# Patient Record
Sex: Female | Born: 1980 | Race: Black or African American | Hispanic: No | Marital: Married | Smoking: Never smoker
Health system: Southern US, Community
[De-identification: ages and names within clinical notes are randomized; demographics above are authoritative.]

## PROBLEM LIST (undated history)

## (undated) ENCOUNTER — Inpatient Hospital Stay (HOSPITAL_COMMUNITY): Payer: Self-pay

## (undated) DIAGNOSIS — R87629 Unspecified abnormal cytological findings in specimens from vagina: Secondary | ICD-10-CM

## (undated) DIAGNOSIS — E669 Obesity, unspecified: Secondary | ICD-10-CM

## (undated) DIAGNOSIS — F419 Anxiety disorder, unspecified: Secondary | ICD-10-CM

## (undated) DIAGNOSIS — Z8619 Personal history of other infectious and parasitic diseases: Secondary | ICD-10-CM

## (undated) DIAGNOSIS — Z8719 Personal history of other diseases of the digestive system: Secondary | ICD-10-CM

## (undated) DIAGNOSIS — B977 Papillomavirus as the cause of diseases classified elsewhere: Secondary | ICD-10-CM

## (undated) DIAGNOSIS — Z8742 Personal history of other diseases of the female genital tract: Secondary | ICD-10-CM

## (undated) DIAGNOSIS — D573 Sickle-cell trait: Secondary | ICD-10-CM

## (undated) DIAGNOSIS — E119 Type 2 diabetes mellitus without complications: Secondary | ICD-10-CM

## (undated) DIAGNOSIS — B009 Herpesviral infection, unspecified: Secondary | ICD-10-CM

## (undated) DIAGNOSIS — K219 Gastro-esophageal reflux disease without esophagitis: Secondary | ICD-10-CM

## (undated) DIAGNOSIS — IMO0002 Reserved for concepts with insufficient information to code with codable children: Secondary | ICD-10-CM

## (undated) DIAGNOSIS — D649 Anemia, unspecified: Secondary | ICD-10-CM

## (undated) DIAGNOSIS — M1990 Unspecified osteoarthritis, unspecified site: Secondary | ICD-10-CM

## (undated) HISTORY — DX: Herpesviral infection, unspecified: B00.9

## (undated) HISTORY — PX: COLPOSCOPY: SHX161

## (undated) HISTORY — DX: Personal history of other infectious and parasitic diseases: Z86.19

## (undated) HISTORY — DX: Unspecified abnormal cytological findings in specimens from vagina: R87.629

## (undated) HISTORY — PX: WISDOM TOOTH EXTRACTION: SHX21

## (undated) HISTORY — DX: Obesity, unspecified: E66.9

---

## 2011-10-14 ENCOUNTER — Other Ambulatory Visit: Payer: Self-pay

## 2011-10-14 LAB — OB RESULTS CONSOLE ANTIBODY SCREEN: Antibody Screen: NEGATIVE

## 2011-10-14 LAB — OB RESULTS CONSOLE ABO/RH

## 2011-10-14 LAB — OB RESULTS CONSOLE GC/CHLAMYDIA
Chlamydia: NEGATIVE
Gonorrhea: NEGATIVE

## 2011-10-14 LAB — OB RESULTS CONSOLE RUBELLA ANTIBODY, IGM: Rubella: IMMUNE

## 2011-11-30 ENCOUNTER — Ambulatory Visit: Payer: Self-pay | Admitting: *Deleted

## 2012-01-06 ENCOUNTER — Encounter: Payer: Self-pay | Admitting: Dietician

## 2012-01-06 ENCOUNTER — Encounter: Payer: 59 | Attending: Obstetrics & Gynecology | Admitting: Dietician

## 2012-01-06 VITALS — Ht 66.0 in | Wt 257.8 lb

## 2012-01-06 DIAGNOSIS — E119 Type 2 diabetes mellitus without complications: Secondary | ICD-10-CM | POA: Insufficient documentation

## 2012-01-06 DIAGNOSIS — Z713 Dietary counseling and surveillance: Secondary | ICD-10-CM | POA: Insufficient documentation

## 2012-01-07 ENCOUNTER — Encounter: Payer: Self-pay | Admitting: Dietician

## 2012-01-07 NOTE — Progress Notes (Signed)
  Patient was seen on 01/06/2012 for Gestational Diabetes self-management class at the [facility]. Ms Komoroski comes with a history of having been diagnosed with Type 2 diabetes in April 2013.  At that time was started on Metformin for glucose control.  When she became pregnant, her MD in [place] told her to stop taking the Metformin.  Currently, she is on no medications for blood glucose control.  She has a blood glucose meter and has resumed checking her fasting blood glucose levels over the last 6-7 days.  Her numbers range from 85-107 mg/dl. Her last HgA1C is reported at 6.9%.    Her EDD is 06/11/2012 and she is currently [redacted] weeks pregnant. Her weight today is 257.8 lb.  She is tending to hover at 256 lb.  The following learning objectives were met by the patient during this course/1;1 session:   States the definition of Gestational Diabetes  States why dietary management is important in controlling blood glucose  Describes the effects each nutrient has on blood glucose levels  Demonstrates ability to create a balanced meal plan  Demonstrates carbohydrate counting   States when to check blood glucose levels  Demonstrates proper blood glucose monitoring techniques  States the effect of stress and exercise on blood glucose levels  States the importance of limiting caffeine and abstaining from alcohol and smoking  Blood glucose monitor given: She has a Financial planner and some strips.  She has been instructed to monitor her blood glucose four times per day.  I have ask her to call her insurance company and find out what their preferred meter is.  This will most likely be her lowest co-pay.  She is to get back with me and perhaps if we have that meter available, I can give her the preferred meter.  She will need an MD prescription for strips and lancets.  Patient instructed to monitor glucose levels: FBS: 60 - <90 2 hour: <120  Patient received  handouts:  Nutrition Diabetes and Pregnancy  Carbohydrate Counting List  Patient will be seen for follow-up as needed.

## 2012-01-18 ENCOUNTER — Other Ambulatory Visit (HOSPITAL_COMMUNITY): Payer: Self-pay | Admitting: Obstetrics & Gynecology

## 2012-01-18 DIAGNOSIS — O24919 Unspecified diabetes mellitus in pregnancy, unspecified trimester: Secondary | ICD-10-CM

## 2012-01-20 ENCOUNTER — Encounter (HOSPITAL_COMMUNITY): Payer: Self-pay

## 2012-01-20 ENCOUNTER — Ambulatory Visit (HOSPITAL_COMMUNITY)
Admission: RE | Admit: 2012-01-20 | Discharge: 2012-01-20 | Disposition: A | Discharge status: Home / Self Care | Payer: 59 | Source: Ambulatory Visit | Attending: Obstetrics & Gynecology | Admitting: Obstetrics & Gynecology

## 2012-01-20 DIAGNOSIS — O358XX0 Maternal care for other (suspected) fetal abnormality and damage, not applicable or unspecified: Secondary | ICD-10-CM | POA: Insufficient documentation

## 2012-01-20 DIAGNOSIS — O24919 Unspecified diabetes mellitus in pregnancy, unspecified trimester: Secondary | ICD-10-CM | POA: Insufficient documentation

## 2012-01-20 DIAGNOSIS — Z1389 Encounter for screening for other disorder: Secondary | ICD-10-CM | POA: Insufficient documentation

## 2012-01-20 DIAGNOSIS — Z363 Encounter for antenatal screening for malformations: Secondary | ICD-10-CM | POA: Insufficient documentation

## 2012-01-30 ENCOUNTER — Encounter (HOSPITAL_COMMUNITY): Payer: Self-pay | Admitting: Obstetrics and Gynecology

## 2012-01-30 ENCOUNTER — Inpatient Hospital Stay (HOSPITAL_COMMUNITY)
Admission: AD | Admit: 2012-01-30 | Discharge: 2012-01-30 | Disposition: A | Discharge status: Home / Self Care | Payer: 59 | Source: Ambulatory Visit | Attending: Obstetrics and Gynecology | Admitting: Obstetrics and Gynecology

## 2012-01-30 DIAGNOSIS — R109 Unspecified abdominal pain: Secondary | ICD-10-CM | POA: Insufficient documentation

## 2012-01-30 DIAGNOSIS — N949 Unspecified condition associated with female genital organs and menstrual cycle: Secondary | ICD-10-CM

## 2012-01-30 DIAGNOSIS — O4700 False labor before 37 completed weeks of gestation, unspecified trimester: Secondary | ICD-10-CM | POA: Insufficient documentation

## 2012-01-30 DIAGNOSIS — O479 False labor, unspecified: Secondary | ICD-10-CM

## 2012-01-30 HISTORY — DX: Type 2 diabetes mellitus without complications: E11.9

## 2012-01-30 LAB — URINALYSIS, ROUTINE W REFLEX MICROSCOPIC
Ketones, ur: >80 mg/dL — AB
Leukocytes, UA: NEGATIVE
Nitrite: NEGATIVE
Protein, ur: NEGATIVE mg/dL
pH: 5.5 (ref 5.0–8.0)

## 2012-01-30 NOTE — MAU Note (Signed)
Pt presents to MAU with chief complaint of contractions. Pt is [redacted]w[redacted]d G1 says around 1230 she started feeling contractions and they continued for the rest of the afternoon. Pt says in a 1 hour period she had 6 contractions; non painful, feels them in her lower abdomen.

## 2012-01-30 NOTE — MAU Provider Note (Signed)
Chief Complaint:  Contractions   First Provider Initiated Contact with Patient 01/30/12 1605      HPI: Kimberly White is a 31 y.o. G1P0000 at 64w0dwho presents to maternity admissions reporting cramping/contractions 6x/hour from 1-2 pm.  Prior to this she had 1-2/hour and she has had 1-2/hour for the last 2 hours.  She reports drinking lots of fluids and urinating more frequently than normal today.  She reports feeling fetal movement, denies LOF, vaginal bleeding, vaginal itching/burning, urinary symptoms, h/a, dizziness, n/v, or fever/chills.     Past Medical History: Past Medical History  Diagnosis Date  . Obesity   . Diabetes mellitus without complication      Past Surgical History: History reviewed. No pertinent past surgical history.  Family History: Family History  Problem Relation Age of Onset  . Hypertension Mother   . Hypertension Father   . Hyperlipidemia Father   . Sleep apnea Brother   . Asthma Maternal Uncle   . Cancer Maternal Uncle   . Hypertension Maternal Grandmother   . Obesity Maternal Grandmother   . Hypertension Maternal Grandfather   . Stroke Other     Social History: History  Substance Use Topics  . Smoking status: Never Smoker   . Smokeless tobacco: Never Used  . Alcohol Use: No    Allergies: No Known Allergies  Meds:  Prescriptions prior to admission  Medication Sig Dispense Refill  . Ascorbic Acid (VITAMIN C) 1000 MG tablet Take 1,000 mg by mouth daily.      . calcium carbonate (TUMS - DOSED IN MG ELEMENTAL CALCIUM) 500 MG chewable tablet Chew 1 tablet by mouth daily. Using as needed for heartburn.      . docusate sodium (COLACE) 100 MG capsule Take 100 mg by mouth daily as needed. constipation      . Prenatal Vit-Fe Fumarate-FA (PRENATAL MULTIVITAMIN) TABS Take 1 tablet by mouth daily.        ROS: Pertinent findings in history of present illness.  Physical Exam  Blood pressure 115/66, pulse 85, temperature 99 F (37.2 C),  temperature source Oral, resp. rate 18. GENERAL: Well-developed, well-nourished female in no acute distress.  HEENT: normocephalic HEART: normal rate RESP: normal effort ABDOMEN: Soft, non-tender, gravid appropriate for gestational age EXTREMITIES: Nontender, no edema NEURO: alert and oriented SPECULUM EXAM: NEFG, physiologic discharge, no blood, cervix clean Dilation: Closed Effacement (%): Thick Cervical Position: Posterior Exam by:: L. Leftwhich-kirby  FHT:  155 by doppler No contractions on toco  Results for orders placed during the hospital encounter of 01/30/12 (from the past 24 hour(s))  URINALYSIS, ROUTINE W REFLEX MICROSCOPIC     Status: Abnormal   Collection Time   01/30/12  3:42 PM      Component Value Range   Color, Urine YELLOW  YELLOW   APPearance CLEAR  CLEAR   Specific Gravity, Urine 1.020  1.005 - 1.030   pH 5.5  5.0 - 8.0   Glucose, UA NEGATIVE  NEGATIVE mg/dL   Hgb urine dipstick NEGATIVE  NEGATIVE   Bilirubin Urine NEGATIVE  NEGATIVE   Ketones, ur >80 (*) NEGATIVE mg/dL   Protein, ur NEGATIVE  NEGATIVE mg/dL   Urobilinogen, UA 0.2  0.0 - 1.0 mg/dL   Nitrite NEGATIVE  NEGATIVE   Leukocytes, UA NEGATIVE  NEGATIVE   Assessment: 1. Round ligament pain   2. Braxton Hicks contractions    Plan: Called Dr Henderson Cloud to review assessment and findings Discharge home Drink more PO fluids, water is best Reviewed good  ergonomics/positioning Follow up as scheduled with Dr Langston Masker Return to MAU as needed     Medication List     As of 01/30/2012  4:13 PM    ASK your doctor about these medications         calcium carbonate 500 MG chewable tablet   Commonly known as: TUMS - dosed in mg elemental calcium   Chew 1 tablet by mouth daily. Using as needed for heartburn.      docusate sodium 100 MG capsule   Commonly known as: COLACE   Take 100 mg by mouth daily as needed. constipation      prenatal multivitamin Tabs   Take 1 tablet by mouth daily.      vitamin  C 1000 MG tablet   Take 1,000 mg by mouth daily.        Sharen Counter Certified Nurse-Midwife 01/30/2012 4:13 PM

## 2012-02-01 ENCOUNTER — Telehealth: Payer: Self-pay | Admitting: Dietician

## 2012-02-01 NOTE — Telephone Encounter (Signed)
Phone Call; 02/01/2012  Siri spoke with her insurance company and they will cover an Accu-Chek or a One Touch Ultra glucose meter.  I have an Accu Chek [employer] Plus meter Lot: [id number], Exp: 03/10/2013 in stock.  I will plan to leave it at the desk for her to pick-up this afternoon.  Maggie Lynkin Saini

## 2012-03-09 ENCOUNTER — Encounter (HOSPITAL_COMMUNITY): Payer: Self-pay

## 2012-03-09 ENCOUNTER — Inpatient Hospital Stay (HOSPITAL_COMMUNITY)
Admission: AD | Admit: 2012-03-09 | Discharge: 2012-03-09 | Disposition: A | Discharge status: Home / Self Care | Payer: 59 | Source: Ambulatory Visit | Attending: Obstetrics and Gynecology | Admitting: Obstetrics and Gynecology

## 2012-03-09 DIAGNOSIS — O99891 Other specified diseases and conditions complicating pregnancy: Secondary | ICD-10-CM | POA: Insufficient documentation

## 2012-03-09 DIAGNOSIS — R109 Unspecified abdominal pain: Secondary | ICD-10-CM

## 2012-03-09 DIAGNOSIS — O26899 Other specified pregnancy related conditions, unspecified trimester: Secondary | ICD-10-CM

## 2012-03-09 HISTORY — DX: Sickle-cell trait: D57.3

## 2012-03-09 HISTORY — DX: Papillomavirus as the cause of diseases classified elsewhere: B97.7

## 2012-03-09 HISTORY — DX: Reserved for concepts with insufficient information to code with codable children: IMO0002

## 2012-03-09 HISTORY — DX: Personal history of other diseases of the female genital tract: Z87.42

## 2012-03-09 LAB — URINE MICROSCOPIC-ADD ON

## 2012-03-09 LAB — URINALYSIS, ROUTINE W REFLEX MICROSCOPIC
Bilirubin Urine: NEGATIVE
Glucose, UA: NEGATIVE mg/dL
Protein, ur: NEGATIVE mg/dL
Specific Gravity, Urine: <1.005 — ABNORMAL LOW (ref 1.005–1.030)
Urobilinogen, UA: 0.2 mg/dL (ref 0.0–1.0)

## 2012-03-09 NOTE — MAU Note (Signed)
Pt states had extremely sharp abd pain on mid/left side of her abdomen 30 minutes ago, happened again moments later. Has not felt since then. Felt like lightning per pt.

## 2012-03-09 NOTE — MAU Note (Signed)
Patient is in with c/o new onset pelvic pain that intensifies with movement. She denies any contractions, vaginal bleeding or lof. Patient is a type II diet controlled.

## 2012-03-09 NOTE — MAU Provider Note (Signed)
History     CSN: [id number]  Arrival date and time: 03/09/12 1610   None     Chief Complaint  Patient presents with  . Abdominal Pain   HPI Pt is [redacted]w[redacted]d pregnant and presents with sharp shotting pain X2 at 3 pm today lasting seconds- 2 occurances within 3 minutes and no more.  Pt denies persistant pain, spotting or bleeding or UTI symptoms.  Pt has been constipated but had a normal bowel movement this morning before the pain.  Past Medical History  Diagnosis Date  . Obesity   . Diabetes mellitus without complication     No past surgical history on file.  Family History  Problem Relation Age of Onset  . Hypertension Mother   . Hypertension Father   . Hyperlipidemia Father   . Sleep apnea Brother   . Asthma Maternal Uncle   . Cancer Maternal Uncle   . Hypertension Maternal Grandmother   . Obesity Maternal Grandmother   . Hypertension Maternal Grandfather   . Stroke Other     History  Substance Use Topics  . Smoking status: Never Smoker   . Smokeless tobacco: Never Used  . Alcohol Use: No    Allergies: No Known Allergies  Prescriptions prior to admission  Medication Sig Dispense Refill  . Ascorbic Acid (VITAMIN C) 1000 MG tablet Take 1,000 mg by mouth daily.      . calcium carbonate (TUMS - DOSED IN MG ELEMENTAL CALCIUM) 500 MG chewable tablet Chew 1 tablet by mouth daily as needed. For heartburn.      . docusate sodium (COLACE) 100 MG capsule Take 100 mg by mouth daily as needed. For constipation.      . Prenatal Vit-Fe Fumarate-FA (PRENATAL MULTIVITAMIN) TABS Take 1 tablet by mouth daily.        Review of Systems  Constitutional: Negative for fever and chills.  Gastrointestinal: Positive for abdominal pain and constipation. Negative for nausea, vomiting and diarrhea.  Genitourinary: Negative for dysuria and urgency.   Physical Exam   Blood pressure 102/73, pulse 94, temperature 97.9 F (36.6 C), temperature source Oral, resp. rate 16, height 5\' 6"  (1.676  m), weight 254 lb 8 oz (115.44 kg).  Physical Exam  Vitals reviewed. Constitutional: She is oriented to person, place, and time. She appears well-developed and well-nourished.  HENT:  Head: Normocephalic.  Eyes: Pupils are equal, round, and reactive to light.  Neck: Normal range of motion. Neck supple.  Respiratory: Effort normal.  GI: Soft. She exhibits no distension. There is no tenderness. There is no rebound and no guarding.       FHR reassuring for gestational age; no ctx; active baby  Musculoskeletal: Normal range of motion.  Neurological: She is alert and oriented to person, place, and time.  Skin: Skin is warm and dry.  Psychiatric: She has a normal mood and affect.    MAU Course  Procedures No pain or tenderness  And no contractions noted; discussed pregnancy belt Pt has appointment with [facility] next Tuesday Results for orders placed during the hospital encounter of 03/09/12 (from the past 24 hour(s))  URINALYSIS, ROUTINE W REFLEX MICROSCOPIC     Status: Abnormal   Collection Time   03/09/12  4:24 PM      Component Value Range   Color, Urine YELLOW  YELLOW   APPearance CLEAR  CLEAR   Specific Gravity, Urine <1.005 (*) 1.005 - 1.030   pH 6.5  5.0 - 8.0   Glucose, UA  NEGATIVE  NEGATIVE mg/dL   Hgb urine dipstick SMALL (*) NEGATIVE   Bilirubin Urine NEGATIVE  NEGATIVE   Ketones, ur NEGATIVE  NEGATIVE mg/dL   Protein, ur NEGATIVE  NEGATIVE mg/dL   Urobilinogen, UA 0.2  0.0 - 1.0 mg/dL   Nitrite NEGATIVE  NEGATIVE   Leukocytes, UA TRACE (*) NEGATIVE  URINE MICROSCOPIC-ADD ON     Status: Normal   Collection Time   03/09/12  4:24 PM      Component Value Range   Squamous Epithelial / LPF RARE  RARE   WBC, UA 0-2  <3 WBC/hpf   RBC / HPF 0-2  <3 RBC/hpf   Dr. Renaldo Fiddler in delivery Assessment and Plan  Discharge home F/u with [facility] Report increase in pain or bleeding  Baylin Gamblin 03/09/2012, 5:03 PM

## 2012-03-29 NOTE — L&D Delivery Note (Signed)
Delivery Note  SVD viable female Apgars 9,9 over 1st degree lac.  Placenta delivered spontaneously intact with 3VC. Repair with 2-0 Chromic with good support and hemostasis noted and R/V exam confirms.  PH art was sent.  Mother and baby were doing well.  EBL 300cc  Candice Camp, MD

## 2012-05-15 LAB — OB RESULTS CONSOLE GBS: GBS: NEGATIVE

## 2012-05-29 ENCOUNTER — Encounter (HOSPITAL_COMMUNITY): Payer: Self-pay | Admitting: *Deleted

## 2012-05-29 ENCOUNTER — Telehealth (HOSPITAL_COMMUNITY): Payer: Self-pay | Admitting: *Deleted

## 2012-05-29 NOTE — Telephone Encounter (Signed)
Preadmission screen  

## 2012-06-02 ENCOUNTER — Inpatient Hospital Stay (HOSPITAL_COMMUNITY)
Admission: AD | Admit: 2012-06-02 | Discharge: 2012-06-02 | Disposition: A | Discharge status: Home / Self Care | Payer: 59 | Source: Ambulatory Visit | Attending: Obstetrics & Gynecology | Admitting: Obstetrics & Gynecology

## 2012-06-02 ENCOUNTER — Encounter (HOSPITAL_COMMUNITY): Payer: Self-pay

## 2012-06-02 DIAGNOSIS — O479 False labor, unspecified: Secondary | ICD-10-CM | POA: Insufficient documentation

## 2012-06-02 MED ORDER — HYDROCODONE-ACETAMINOPHEN 5-325 MG PO TABS
1.0000 | ORAL_TABLET | Freq: Once | ORAL | Status: DC
  Filled 2012-06-02: qty 1

## 2012-06-02 NOTE — Progress Notes (Signed)
MD notified of pt status, DC order received.  Pt may have pain med if she wants.

## 2012-06-02 NOTE — MAU Note (Signed)
Contractions every 5-7 minutes since 3am this morning. Denies leaking of fluid or vaginal bleeding. SVE 3cm in office yesterday.

## 2012-06-04 ENCOUNTER — Inpatient Hospital Stay (HOSPITAL_COMMUNITY): Payer: 59 | Admitting: Anesthesiology

## 2012-06-04 ENCOUNTER — Encounter (HOSPITAL_COMMUNITY): Payer: Self-pay | Admitting: Family

## 2012-06-04 ENCOUNTER — Encounter (HOSPITAL_COMMUNITY): Payer: Self-pay | Admitting: Anesthesiology

## 2012-06-04 ENCOUNTER — Inpatient Hospital Stay (HOSPITAL_COMMUNITY)
Admission: AD | Admit: 2012-06-04 | Discharge: 2012-06-06 | DRG: 774 | Disposition: A | Discharge status: Home / Self Care | Payer: 59 | Source: Ambulatory Visit | Attending: Obstetrics and Gynecology | Admitting: Obstetrics and Gynecology

## 2012-06-04 ENCOUNTER — Inpatient Hospital Stay (HOSPITAL_COMMUNITY): Admission: RE | Admit: 2012-06-04 | Payer: 59 | Source: Ambulatory Visit

## 2012-06-04 DIAGNOSIS — D573 Sickle-cell trait: Secondary | ICD-10-CM | POA: Diagnosis present

## 2012-06-04 DIAGNOSIS — O9902 Anemia complicating childbirth: Secondary | ICD-10-CM | POA: Diagnosis present

## 2012-06-04 DIAGNOSIS — E119 Type 2 diabetes mellitus without complications: Secondary | ICD-10-CM | POA: Diagnosis present

## 2012-06-04 DIAGNOSIS — O2432 Unspecified pre-existing diabetes mellitus in childbirth: Secondary | ICD-10-CM | POA: Diagnosis present

## 2012-06-04 LAB — CBC
HCT: 34 % — ABNORMAL LOW (ref 36.0–46.0)
Hemoglobin: 11.7 g/dL — ABNORMAL LOW (ref 12.0–15.0)
MCH: 28.3 pg (ref 26.0–34.0)
MCHC: 34.4 g/dL (ref 30.0–36.0)
MCV: 82.1 fL (ref 78.0–100.0)

## 2012-06-04 LAB — GLUCOSE, CAPILLARY: Glucose-Capillary: 72 mg/dL (ref 70–99)

## 2012-06-04 LAB — ABO/RH: ABO/RH(D): O POS

## 2012-06-04 MED ORDER — FENTANYL 2.5 MCG/ML BUPIVACAINE 1/10 % EPIDURAL INFUSION (WH - ANES)
INTRAMUSCULAR | Status: DC | PRN
  Administered 2012-06-04: 14 mL/h via EPIDURAL

## 2012-06-04 MED ORDER — CITRIC ACID-SODIUM CITRATE 334-500 MG/5ML PO SOLN: 30.0000 mL | ORAL | Status: DC | PRN

## 2012-06-04 MED ORDER — ONDANSETRON HCL 4 MG/2ML IJ SOLN: 4.0000 mg | Freq: Four times a day (QID) | INTRAMUSCULAR | Status: DC | PRN

## 2012-06-04 MED ORDER — PHENYLEPHRINE 40 MCG/ML (10ML) SYRINGE FOR IV PUSH (FOR BLOOD PRESSURE SUPPORT): 80.0000 ug | PREFILLED_SYRINGE | INTRAVENOUS | Status: DC | PRN

## 2012-06-04 MED ORDER — LACTATED RINGERS IV SOLN
500.0000 mL | Freq: Once | INTRAVENOUS | Status: AC
  Administered 2012-06-04: 19:00:00 via INTRAVENOUS

## 2012-06-04 MED ORDER — IBUPROFEN 600 MG PO TABS
600.0000 mg | ORAL_TABLET | Freq: Four times a day (QID) | ORAL | Status: DC | PRN
  Administered 2012-06-05: 600 mg via ORAL
  Filled 2012-06-04: qty 1

## 2012-06-04 MED ORDER — DIPHENHYDRAMINE HCL 50 MG/ML IJ SOLN
12.5000 mg | INTRAMUSCULAR | Status: DC | PRN
  Administered 2012-06-04 (×2): 12.5 mg via INTRAVENOUS
  Filled 2012-06-04 (×2): qty 1

## 2012-06-04 MED ORDER — EPHEDRINE 5 MG/ML INJ
10.0000 mg | INTRAVENOUS | Status: DC | PRN
  Filled 2012-06-04: qty 4

## 2012-06-04 MED ORDER — LACTATED RINGERS IV SOLN: 500.0000 mL | INTRAVENOUS | Status: DC | PRN

## 2012-06-04 MED ORDER — PHENYLEPHRINE 40 MCG/ML (10ML) SYRINGE FOR IV PUSH (FOR BLOOD PRESSURE SUPPORT)
80.0000 ug | PREFILLED_SYRINGE | INTRAVENOUS | Status: DC | PRN
  Filled 2012-06-04: qty 5

## 2012-06-04 MED ORDER — PRENATAL MULTIVITAMIN CH: 1.0000 | ORAL_TABLET | Freq: Every day | ORAL | Status: DC

## 2012-06-04 MED ORDER — LIDOCAINE HCL (PF) 1 % IJ SOLN
INTRAMUSCULAR | Status: DC | PRN
  Administered 2012-06-04 (×2): 5 mL

## 2012-06-04 MED ORDER — OXYTOCIN BOLUS FROM INFUSION
500.0000 mL | INTRAVENOUS | Status: DC
  Administered 2012-06-05: 500 mL via INTRAVENOUS

## 2012-06-04 MED ORDER — EPHEDRINE 5 MG/ML INJ: 10.0000 mg | INTRAVENOUS | Status: DC | PRN

## 2012-06-04 MED ORDER — FENTANYL 2.5 MCG/ML BUPIVACAINE 1/10 % EPIDURAL INFUSION (WH - ANES)
14.0000 mL/h | INTRAMUSCULAR | Status: DC
  Filled 2012-06-04: qty 125

## 2012-06-04 MED ORDER — LACTATED RINGERS IV SOLN
INTRAVENOUS | Status: DC
  Administered 2012-06-04: 19:00:00 via INTRAVENOUS

## 2012-06-04 MED ORDER — OXYCODONE-ACETAMINOPHEN 5-325 MG PO TABS: 1.0000 | ORAL_TABLET | ORAL | Status: DC | PRN

## 2012-06-04 MED ORDER — LIDOCAINE HCL (PF) 1 % IJ SOLN
30.0000 mL | INTRAMUSCULAR | Status: DC | PRN
  Filled 2012-06-04: qty 30

## 2012-06-04 MED ORDER — ACETAMINOPHEN 325 MG PO TABS: 650.0000 mg | ORAL_TABLET | ORAL | Status: DC | PRN

## 2012-06-04 MED ORDER — GLYBURIDE 2.5 MG PO TABS
2.5000 mg | ORAL_TABLET | Freq: Every day | ORAL | Status: DC
  Filled 2012-06-04: qty 1

## 2012-06-04 MED ORDER — OXYTOCIN 40 UNITS IN LACTATED RINGERS INFUSION - SIMPLE MED
62.5000 mL/h | INTRAVENOUS | Status: DC
  Filled 2012-06-04: qty 1000

## 2012-06-04 MED ORDER — FLEET ENEMA 7-19 GM/118ML RE ENEM: 1.0000 | ENEMA | RECTAL | Status: DC | PRN

## 2012-06-04 NOTE — Anesthesia Procedure Notes (Signed)
Epidural Patient location during procedure: OB  Staffing Anesthesiologist: CARIGNAN, PETER Performed by: anesthesiologist   Preanesthetic Checklist Completed: patient identified, site marked, surgical consent, pre-op evaluation, timeout performed, IV checked, risks and benefits discussed and monitors and equipment checked  Epidural Patient position: sitting Prep: ChloraPrep Patient monitoring: heart rate, continuous pulse ox and blood pressure Approach: right paramedian Injection technique: LOR saline  Needle:  Needle type: Tuohy  Needle gauge: 17 G Needle length: 9 cm and 9 Catheter type: closed end flexible Catheter size: 20 Guage Test dose: negative  Assessment Events: blood not aspirated, injection not painful, no injection resistance, negative IV test and no paresthesia  Additional Notes   Patient tolerated the insertion well without complications.   

## 2012-06-04 NOTE — MAU Note (Signed)
Patient presents to MAU with c/o contractions every 5 minutes since 1700 today; denies vaginal bleeding.  Patient scheduled for induction tonight at 1930.
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
## 2012-06-04 NOTE — Anesthesia Preprocedure Evaluation (Signed)
Anesthesia Evaluation  Patient identified by MRN, date of birth, ID band Patient awake    Reviewed: Allergy & Precautions, H&P , NPO status , Patient's Chart, lab work & pertinent test results  History of Anesthesia Complications Negative for: history of anesthetic complications  Airway Mallampati: II TM Distance: >3 FB Neck ROM: full    Dental no notable dental hx. (+) Teeth Intact   Pulmonary neg pulmonary ROS,  breath sounds clear to auscultation  Pulmonary exam normal       Cardiovascular negative cardio ROS  Rhythm:regular Rate:Normal     Neuro/Psych negative neurological ROS  negative psych ROS   GI/Hepatic negative GI ROS, Neg liver ROS,   Endo/Other  negative endocrine ROSdiabetesMorbid obesity  Renal/GU negative Renal ROS  negative genitourinary   Musculoskeletal   Abdominal Normal abdominal exam  (+)   Peds  Hematology negative hematology ROS (+)   Anesthesia Other Findings   Reproductive/Obstetrics (+) Pregnancy                           Anesthesia Physical Anesthesia Plan  ASA: II  Anesthesia Plan: Epidural   Post-op Pain Management:    Induction:   Airway Management Planned:   Additional Equipment:   Intra-op Plan:   Post-operative Plan:   Informed Consent: I have reviewed the patients History and Physical, chart, labs and discussed the procedure including the risks, benefits and alternatives for the proposed anesthesia with the patient or authorized representative who has indicated his/her understanding and acceptance.     Plan Discussed with:   Anesthesia Plan Comments:         Anesthesia Quick Evaluation

## 2012-06-05 ENCOUNTER — Encounter (HOSPITAL_COMMUNITY): Payer: Self-pay | Admitting: *Deleted

## 2012-06-05 LAB — CBC
HCT: 28.1 % — ABNORMAL LOW (ref 36.0–46.0)
MCHC: 34.5 g/dL (ref 30.0–36.0)
MCV: 81.9 fL (ref 78.0–100.0)
Platelets: 207 10*3/uL (ref 150–400)
RDW: 13.6 % (ref 11.5–15.5)
WBC: 11.5 10*3/uL — ABNORMAL HIGH (ref 4.0–10.5)

## 2012-06-05 LAB — GLUCOSE, CAPILLARY

## 2012-06-05 MED ORDER — DIPHENHYDRAMINE HCL 25 MG PO CAPS: 25.0000 mg | ORAL_CAPSULE | Freq: Four times a day (QID) | ORAL | Status: DC | PRN

## 2012-06-05 MED ORDER — MEDROXYPROGESTERONE ACETATE 150 MG/ML IM SUSP: 150.0000 mg | INTRAMUSCULAR | Status: DC | PRN

## 2012-06-05 MED ORDER — ONDANSETRON HCL 4 MG/2ML IJ SOLN: 4.0000 mg | INTRAMUSCULAR | Status: DC | PRN

## 2012-06-05 MED ORDER — LANOLIN HYDROUS EX OINT: TOPICAL_OINTMENT | CUTANEOUS | Status: DC | PRN

## 2012-06-05 MED ORDER — OXYCODONE-ACETAMINOPHEN 5-325 MG PO TABS
1.0000 | ORAL_TABLET | ORAL | Status: DC | PRN
  Administered 2012-06-05: 1 via ORAL
  Filled 2012-06-05: qty 1

## 2012-06-05 MED ORDER — MEASLES, MUMPS & RUBELLA VAC ~~LOC~~ INJ
0.5000 mL | INJECTION | Freq: Once | SUBCUTANEOUS | Status: DC
  Filled 2012-06-05: qty 0.5

## 2012-06-05 MED ORDER — DIBUCAINE 1 % RE OINT: 1.0000 "application " | TOPICAL_OINTMENT | RECTAL | Status: DC | PRN

## 2012-06-05 MED ORDER — WITCH HAZEL-GLYCERIN EX PADS: 1.0000 "application " | MEDICATED_PAD | CUTANEOUS | Status: DC | PRN

## 2012-06-05 MED ORDER — PRENATAL MULTIVITAMIN CH
1.0000 | ORAL_TABLET | Freq: Every day | ORAL | Status: DC
Start: 2012-06-05 — End: 2012-06-06
  Administered 2012-06-05: 1 via ORAL
  Filled 2012-06-05: qty 1

## 2012-06-05 MED ORDER — SENNOSIDES-DOCUSATE SODIUM 8.6-50 MG PO TABS
2.0000 | ORAL_TABLET | Freq: Every day | ORAL | Status: DC
  Administered 2012-06-05: 2 via ORAL

## 2012-06-05 MED ORDER — SIMETHICONE 80 MG PO CHEW: 80.0000 mg | CHEWABLE_TABLET | ORAL | Status: DC | PRN

## 2012-06-05 MED ORDER — IBUPROFEN 600 MG PO TABS
600.0000 mg | ORAL_TABLET | Freq: Four times a day (QID) | ORAL | Status: DC
  Administered 2012-06-05 – 2012-06-06 (×5): 600 mg via ORAL
  Filled 2012-06-05 (×5): qty 1

## 2012-06-05 MED ORDER — BENZOCAINE-MENTHOL 20-0.5 % EX AERO
1.0000 "application " | INHALATION_SPRAY | CUTANEOUS | Status: DC | PRN
  Filled 2012-06-05: qty 56

## 2012-06-05 MED ORDER — ONDANSETRON HCL 4 MG PO TABS: 4.0000 mg | ORAL_TABLET | ORAL | Status: DC | PRN

## 2012-06-05 MED ORDER — TETANUS-DIPHTH-ACELL PERTUSSIS 5-2.5-18.5 LF-MCG/0.5 IM SUSP: 0.5000 mL | Freq: Once | INTRAMUSCULAR | Status: DC

## 2012-06-05 MED ORDER — ZOLPIDEM TARTRATE 5 MG PO TABS: 5.0000 mg | ORAL_TABLET | Freq: Every evening | ORAL | Status: DC | PRN

## 2012-06-05 NOTE — Anesthesia Postprocedure Evaluation (Signed)
  Anesthesia Post-op Note  Patient: Kimberly White  Procedure(s) Performed: * No procedures listed *  Patient Location: PACU and Mother/Baby  Anesthesia Type:Epidural  Level of Consciousness: awake, alert  and oriented  Airway and Oxygen Therapy: Patient Spontanous Breathing  Post-op Pain: mild  Post-op Assessment: Patient's Cardiovascular Status Stable, Respiratory Function Stable, No signs of Nausea or vomiting, Adequate PO intake, Pain level controlled, No headache, No backache, No residual numbness and No residual motor weakness  Post-op Vital Signs: stable  Complications: No apparent anesthesia complications

## 2012-06-05 NOTE — Progress Notes (Signed)
Post Partum Day 0 Subjective: no complaints, up ad lib, voiding, tolerating PO and + flatus  Objective: Blood pressure 105/79, pulse 69, temperature 98.4 F (36.9 C), temperature source Oral, resp. rate 18, height 5\' 6"  (1.676 m), weight 262 lb (118.842 kg), SpO2 100.00%, unknown if currently breastfeeding.  Physical Exam:  General: alert and cooperative Lochia: appropriate Uterine Fundus: firm Incision: perineum intact DVT Evaluation: No evidence of DVT seen on physical exam. No significant calf/ankle edema.   Recent Labs  06/04/12 1830 06/05/12 0605  HGB 11.7* 9.7*  HCT 34.0* 28.1*    Assessment/Plan: Plan for discharge tomorrow   LOS: 1 day   CURTIS,CAROL G 06/05/2012, 7:59 AM

## 2012-06-05 NOTE — Lactation Note (Signed)
This note was copied from the chart of Kimberly Eimi Viney. Lactation Consultation Note Initial consultation for this first time mom. Baby is 8 hours old at this time. Mom states baby latched well soon after birth, but has not latched well since.  Mom states baby was very fussy last night, so she did request formula for baby, and states baby then went to sleep. Mom is concerned that her baby was very hungry and wasn't getting any at the breast. Asked mom what her desires are for feeding baby; mom states she does really want to br feed baby.  Offered to assist mom with position and latch; mom accepts. Used doll to demonstrate positioning, and mom was able to comfortably position baby in the football on the left with minimal assistance. Instructed mom in hand expression; however mom was not able to hand express any drops of colostrum at this time. Baby did not wake for the feeding despite using waking techniques. Baby left STS and mom enc to try again later. Mom enc to call for help when ready for next feeding. DEP is in room and set up; mom states she has been shown how to use the pump, has not pumped yet. Enc mom to pump or br feed at least every 3 hours. Written instructions provided.  Lactation brochure provided for mom and mom informed of o/p services and BFSG. Questions answered.   Patient Name: Kimberly White [id number] Date: 06/05/2012 Reason for consult: Initial assessment   Maternal Data Formula Feeding for Exclusion: Yes Reason for exclusion: Mother's choice to formula and breast feed on admission Infant to breast within first hour of birth: Yes Has patient been taught Hand Expression?: Yes Does the patient have breastfeeding experience prior to this delivery?: No  Feeding Feeding Type: Breast Milk Feeding method: Bottle Nipple Type: Slow - flow Length of feed: 0 min  LATCH Score/Interventions Latch: Too sleepy or reluctant, no latch achieved, no sucking  elicited. Intervention(s): Skin to skin;Teach feeding cues;Waking techniques                    Lactation Tools Discussed/Used     Consult Status Consult Status: Follow-up Follow-up type: In-patient    Octavio Manns [facility] 06/05/2012, 10:24 AM

## 2012-06-06 ENCOUNTER — Encounter (HOSPITAL_COMMUNITY)
Admission: RE | Admit: 2012-06-06 | Discharge: 2012-06-06 | Disposition: A | Discharge status: Home / Self Care | Payer: 59 | Source: Ambulatory Visit | Attending: Obstetrics & Gynecology | Admitting: Obstetrics & Gynecology

## 2012-06-06 DIAGNOSIS — O923 Agalactia: Secondary | ICD-10-CM | POA: Insufficient documentation

## 2012-06-06 MED ORDER — OXYCODONE-ACETAMINOPHEN 5-325 MG PO TABS: 1.0000 | ORAL_TABLET | ORAL | Status: DC | PRN

## 2012-06-06 MED ORDER — IBUPROFEN 600 MG PO TABS: 600.0000 mg | ORAL_TABLET | Freq: Four times a day (QID) | ORAL | Status: DC
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
## 2012-06-06 NOTE — Discharge Summary (Signed)
Obstetric Discharge Summary Reason for Admission: onset of labor Prenatal Procedures: NST and ultrasound Intrapartum Procedures: spontaneous vaginal delivery Postpartum Procedures: none Complications-Operative and Postpartum: 1 degree perineal laceration Hemoglobin  Date Value Range Status  06/05/2012 9.7* 12.0 - 15.0 g/dL Final     REPEATED TO VERIFY     DELTA CHECK NOTED     HCT  Date Value Range Status  06/05/2012 28.1* 36.0 - 46.0 % Final    Physical Exam:  General: alert and cooperative Lochia: appropriate Uterine Fundus: firm Incision: perineum intact DVT Evaluation: No evidence of DVT seen on physical exam. No significant calf/ankle edema.  Discharge Diagnoses: Term Pregnancy-delivered  Discharge Information: Date: 06/06/2012 Activity: pelvic rest Diet: routine Medications: PNV, Ibuprofen and Percocet Condition: stable Instructions: refer to practice specific booklet Discharge to: home   Newborn Data: Live born female  Birth Weight: 6 lb 8 oz (2948 g) APGAR: 9, 9  Home with mother.  CURTIS,CAROL G 06/06/2012, 8:01 AM

## 2012-12-07 ENCOUNTER — Encounter (HOSPITAL_COMMUNITY): Payer: Self-pay | Admitting: Emergency Medicine

## 2012-12-07 ENCOUNTER — Emergency Department (INDEPENDENT_AMBULATORY_CARE_PROVIDER_SITE_OTHER)
Admission: EM | Admit: 2012-12-07 | Discharge: 2012-12-07 | Disposition: A | Discharge status: Home / Self Care | Payer: 59 | Source: Home / Self Care | Attending: Emergency Medicine | Admitting: Emergency Medicine

## 2012-12-07 DIAGNOSIS — J069 Acute upper respiratory infection, unspecified: Secondary | ICD-10-CM

## 2012-12-07 NOTE — ED Provider Notes (Signed)
Chief Complaint:   Chief Complaint  Patient presents with  . Sore Throat    History of Present Illness:   Kimberly White is a 32 year old female who is [redacted] weeks pregnant. She's had a four-day history of nasal congestion with yellow drainage with blood, headache, sore throat, cough productive green sputum, or ears felt hot, she felt exhausted and nausea. She denies any fever or or chills. She's had no earache, chest pain, or difficulty breathing. No vomiting or diarrhea. Her pregnancy has been uneventful with the exception of some cramping and spotting, but she's had 2 ultrasounds everything has been normal. She has type 2 diabetes and takes glyburide 2.5 mg a day.  Review of Systems:  Other than noted above, the patient denies any of the following symptoms: Systemic:  No fevers, chills, sweats, weight loss or gain, fatigue, or tiredness. Eye:  No redness or discharge. ENT:  No ear pain, drainage, headache, nasal congestion, drainage, sinus pressure, difficulty swallowing, or sore throat. Neck:  No neck pain or swollen glands. Lungs:  No cough, sputum production, hemoptysis, wheezing, chest tightness, shortness of breath or chest pain. GI:  No abdominal pain, nausea, vomiting or diarrhea.  PMFSH:  Past medical history, family history, social history, meds, and allergies were reviewed.   Physical Exam:   Vital signs:  BP 120/85  Pulse 80  Temp(Src) 97.7 F (36.5 C) (Oral)  Resp 15  SpO2 100%  Breastfeeding? No General:  Alert and oriented.  In no distress.  Skin warm and dry. Eye:  No conjunctival injection or drainage. Lids were normal. ENT:  TMs and canals were normal, without erythema or inflammation.  Nasal mucosa was clear and uncongested, without drainage.  Mucous membranes were moist.  Pharynx was clear with no exudate or drainage.  There were no oral ulcerations or lesions. Neck:  Supple, no adenopathy, tenderness or mass. Lungs:  No respiratory distress.  Lungs were clear to  auscultation, without wheezes, rales or rhonchi.  Breath sounds were clear and equal bilaterally.  Heart:  Regular rhythm, without gallops, murmers or rubs. Skin:  Clear, warm, and dry, without rash or lesions.  Labs:   Results for orders placed during the hospital encounter of 12/07/12  POCT RAPID STREP A ([facility]RG CARE ONLY)      Result Value Range   Streptococcus, Group A Screen (Direct) NEGATIVE  NEGATIVE    Assessment:  The encounter diagnosis was Viral URI.  No indication for antibiotics. Suggested symptomatic treatment only. She can take Tylenol, Claritin, and hot salt water gargles.  Plan:   1.  The following meds were prescribed:   Discharge Medication List as of 12/07/2012  5:24 PM     2.  The patient was instructed in symptomatic care and handouts were given. 3.  The patient was told to return if becoming worse in any way, if no better in 3 or 4 days, and given some red flag symptoms such as fever or difficulty breathing that would indicate earlier return. 4.  Follow up here if necessary.      Reuben Likes, MD 12/07/12 [phone]

## 2012-12-07 NOTE — ED Notes (Signed)
Pt c/o sore throat, headache, fatigue, productive cough with yellow/greenish phlegm, and nasal congestion x 3 days. Pt states she took tylenol with no relief. Jan Ranson, SMA

## 2012-12-08 LAB — OB RESULTS CONSOLE HIV ANTIBODY (ROUTINE TESTING): HIV: NONREACTIVE

## 2012-12-08 LAB — OB RESULTS CONSOLE GC/CHLAMYDIA
Chlamydia: NEGATIVE
Gonorrhea: NEGATIVE

## 2012-12-08 LAB — OB RESULTS CONSOLE RUBELLA ANTIBODY, IGM: RUBELLA: IMMUNE

## 2012-12-08 LAB — OB RESULTS CONSOLE ABO/RH: RH TYPE: POSITIVE

## 2012-12-08 LAB — OB RESULTS CONSOLE HEPATITIS B SURFACE ANTIGEN: Hepatitis B Surface Ag: NEGATIVE

## 2012-12-08 LAB — OB RESULTS CONSOLE ANTIBODY SCREEN: ANTIBODY SCREEN: NEGATIVE

## 2012-12-08 LAB — OB RESULTS CONSOLE RPR: RPR: NONREACTIVE

## 2012-12-09 LAB — CULTURE, GROUP A STREP

## 2013-01-26 ENCOUNTER — Other Ambulatory Visit: Payer: Self-pay

## 2013-03-05 ENCOUNTER — Other Ambulatory Visit (HOSPITAL_COMMUNITY): Payer: Self-pay | Admitting: Obstetrics & Gynecology

## 2013-03-05 DIAGNOSIS — E119 Type 2 diabetes mellitus without complications: Secondary | ICD-10-CM

## 2013-03-06 ENCOUNTER — Encounter (HOSPITAL_COMMUNITY): Payer: Self-pay

## 2013-03-06 ENCOUNTER — Ambulatory Visit (HOSPITAL_COMMUNITY)
Admission: RE | Admit: 2013-03-06 | Discharge: 2013-03-06 | Disposition: A | Discharge status: Home / Self Care | Payer: 59 | Source: Ambulatory Visit | Attending: Obstetrics & Gynecology | Admitting: Obstetrics & Gynecology

## 2013-03-06 DIAGNOSIS — O358XX0 Maternal care for other (suspected) fetal abnormality and damage, not applicable or unspecified: Secondary | ICD-10-CM | POA: Insufficient documentation

## 2013-03-06 DIAGNOSIS — E119 Type 2 diabetes mellitus without complications: Secondary | ICD-10-CM

## 2013-03-06 DIAGNOSIS — Z1389 Encounter for screening for other disorder: Secondary | ICD-10-CM | POA: Insufficient documentation

## 2013-03-06 DIAGNOSIS — O24919 Unspecified diabetes mellitus in pregnancy, unspecified trimester: Secondary | ICD-10-CM | POA: Insufficient documentation

## 2013-03-06 DIAGNOSIS — O98519 Other viral diseases complicating pregnancy, unspecified trimester: Secondary | ICD-10-CM | POA: Insufficient documentation

## 2013-03-06 DIAGNOSIS — E669 Obesity, unspecified: Secondary | ICD-10-CM | POA: Insufficient documentation

## 2013-03-06 DIAGNOSIS — O3440 Maternal care for other abnormalities of cervix, unspecified trimester: Secondary | ICD-10-CM | POA: Insufficient documentation

## 2013-03-06 DIAGNOSIS — Z363 Encounter for antenatal screening for malformations: Secondary | ICD-10-CM | POA: Insufficient documentation

## 2013-03-06 DIAGNOSIS — A6000 Herpesviral infection of urogenital system, unspecified: Secondary | ICD-10-CM | POA: Insufficient documentation

## 2013-03-06 DIAGNOSIS — O09529 Supervision of elderly multigravida, unspecified trimester: Secondary | ICD-10-CM | POA: Insufficient documentation

## 2013-03-30 ENCOUNTER — Encounter (HOSPITAL_COMMUNITY): Payer: Self-pay | Admitting: *Deleted

## 2013-03-30 ENCOUNTER — Inpatient Hospital Stay (HOSPITAL_COMMUNITY)
Admission: AD | Admit: 2013-03-30 | Discharge: 2013-03-30 | Disposition: A | Discharge status: Home / Self Care | Payer: 59 | Source: Ambulatory Visit | Attending: Obstetrics & Gynecology | Admitting: Obstetrics & Gynecology

## 2013-03-30 DIAGNOSIS — R05 Cough: Secondary | ICD-10-CM | POA: Insufficient documentation

## 2013-03-30 DIAGNOSIS — O9989 Other specified diseases and conditions complicating pregnancy, childbirth and the puerperium: Principal | ICD-10-CM

## 2013-03-30 DIAGNOSIS — J069 Acute upper respiratory infection, unspecified: Secondary | ICD-10-CM

## 2013-03-30 DIAGNOSIS — O99891 Other specified diseases and conditions complicating pregnancy: Secondary | ICD-10-CM | POA: Insufficient documentation

## 2013-03-30 DIAGNOSIS — R059 Cough, unspecified: Secondary | ICD-10-CM | POA: Insufficient documentation

## 2013-03-30 DIAGNOSIS — J111 Influenza due to unidentified influenza virus with other respiratory manifestations: Secondary | ICD-10-CM | POA: Insufficient documentation

## 2013-03-30 DIAGNOSIS — R51 Headache: Secondary | ICD-10-CM | POA: Insufficient documentation

## 2013-03-30 LAB — URINALYSIS, ROUTINE W REFLEX MICROSCOPIC
BILIRUBIN URINE: NEGATIVE
GLUCOSE, UA: NEGATIVE mg/dL
Hgb urine dipstick: NEGATIVE
KETONES UR: NEGATIVE mg/dL
Leukocytes, UA: NEGATIVE
NITRITE: NEGATIVE
PH: 6 (ref 5.0–8.0)
Protein, ur: NEGATIVE mg/dL
SPECIFIC GRAVITY, URINE: 1.015 (ref 1.005–1.030)
Urobilinogen, UA: 0.2 mg/dL (ref 0.0–1.0)

## 2013-03-30 MED ORDER — ACETAMINOPHEN-CODEINE #3 300-30 MG PO TABS: 1.0000 | ORAL_TABLET | Freq: Four times a day (QID) | ORAL | Status: DC | PRN

## 2013-03-30 MED ORDER — OSELTAMIVIR PHOSPHATE 75 MG PO CAPS: 75.0000 mg | ORAL_CAPSULE | Freq: Two times a day (BID) | ORAL | Status: DC

## 2013-03-30 MED ORDER — AZITHROMYCIN 250 MG PO TABS: ORAL_TABLET | ORAL | Status: DC

## 2013-03-30 NOTE — Discharge Instructions (Signed)
Cough, Adult  A cough is a reflex. It helps you clear your throat and airways. A cough can help heal your body. A cough can last 2 or 3 weeks (acute) or may last more than 8 weeks (chronic). Some common causes of a cough can include an infection, allergy, or a cold. HOME CARE  Only take medicine as told by your doctor.  If given, take your medicines (antibiotics) as told. Finish them even if you start to feel better.  Use a cold steam vaporizer or humidier in your home. This can help loosen thick spit (secretions).  Sleep so you are almost sitting up (semi-upright). Use pillows to do this. This helps reduce coughing.  Rest as needed.  Stop smoking if you smoke. GET HELP RIGHT AWAY IF:  You have yellowish-white fluid (pus) in your thick spit.  Your cough gets worse.  Your medicine does not reduce coughing, and you are losing sleep.  You cough up blood.  You have trouble breathing.  Your pain gets worse and medicine does not help.  You have a fever. MAKE SURE YOU:   Understand these instructions.  Will watch your condition.  Will get help right away if you are not doing well or get worse. Document Released: 11/26/2010 Document Revised: 06/07/2011 Document Reviewed: 11/26/2010 [facility] Patient Information 2014 [place], [place]. Influenza A (H1N1) H1N1 formerly called "swine flu" is a new influenza virus causing sickness in people. The H1N1 virus is different from seasonal influenza viruses. However, the H1N1 symptoms are similar to seasonal influenza and it is spread from person to person. You may be at higher risk for serious problems if you have underlying serious medical conditions. The CDC and the [employer] Health Organization are following reported cases around the world. CAUSES   The flu is thought to spread mainly person-to-person through coughing or sneezing of infected people.  A person may become infected by touching something with the virus on it and then touching  their mouth or nose. SYMPTOMS   Fever.  Headache.  Tiredness.  Cough.  Sore throat.  Runny or stuffy nose.  Body aches.  Diarrhea and vomiting These symptoms are referred to as "flu-like symptoms." A lot of different illnesses, including the common cold, may have similar symptoms. DIAGNOSIS   There are tests that can tell if you have the H1N1 virus.  Confirmed cases of H1N1 will be reported to the state or local health department.  A doctor's exam may be needed to tell whether you have an infection that is a complication of the flu. HOME CARE INSTRUCTIONS   Stay informed. Visit the [facility]CDC website for current recommendations. Visit [employer]tnwww.cdc.gov/H1N1flu/. You may also call 1-800-CDC-INFO (40324881501-512-557-2274).  Get help early if you develop any of the above symptoms.  If you are at high risk from complications of the flu, talk to your caregiver as soon as you develop flu-like symptoms. Those at higher risk for complications include:  People 65 years or older.  People with chronic medical conditions.  Pregnant women.  Young children.  Your caregiver may recommend antiviral medicine to help treat the flu.  If you get the flu, get plenty of rest, drink enough water and fluids to keep your urine clear or pale yellow, and avoid using alcohol or tobacco.  You may take over-the-counter medicine to relieve the symptoms of the flu if your caregiver approves. (Never give aspirin to children or teenagers who have flu-like symptoms, particularly fever). TREATMENT  If you do get sick, antiviral  drugs are available. These drugs can make your illness milder and make you feel better faster. Treatment should start soon after illness starts. It is only effective if taken within the first day of becoming ill. Only your caregiver can prescribe antiviral medication.  PREVENTION   Cover your nose and mouth with a tissue or your arm when you cough or sneeze. Throw the tissue away.  Wash your hands  often with soap and warm water, especially after you cough or sneeze. Alcohol-based cleaners are also effective against germs.  Avoid touching your eyes, nose or mouth. This is one way germs spread.  Try to avoid contact with sick people. Follow public health advice regarding school closures. Avoid crowds.  Stay home if you get sick. Limit contact with others to keep from infecting them. People infected with the H1N1 virus may be able to infect others anywhere from 1 day before feeling sick to 5-7 days after getting flu symptoms.  An H1N1 vaccine is available to help protect against the virus. In addition to the H1N1 vaccine, you will need to be vaccinated for seasonal influenza. The H1N1 and seasonal vaccines may be given on the same day. The CDC especially recommends the H1N1 vaccine for:  Pregnant women.  People who live with or care for children younger than 75 months of age.  Health care and emergency services personnel.  Persons between the ages of 45 months through 60 years of age.  People from ages 9 through 73 years who are at higher risk for H1N1 because of chronic health disorders or immune system problems. FACEMASKS In community and home settings, the use of facemasks and N95 respirators are not normally recommended. In certain circumstances, a facemask or N95 respirator may be used for persons at increased risk of severe illness from influenza. Your caregiver can give additional recommendations for facemask use. IN CHILDREN, EMERGENCY WARNING SIGNS THAT NEED URGENT MEDICAL CARE:  Fast breathing or trouble breathing.  Bluish skin color.  Not drinking enough fluids.  Not waking up or not interacting normally.  Being so fussy that the child does not want to be held.  Your child has an oral temperature above 102 F (38.9 C), not controlled by medicine.  Your baby is older than 3 months with a rectal temperature of 102 F (38.9 C) or higher.  Your baby is 32 months old or  younger with a rectal temperature of 100.4 F (38 C) or higher.  Flu-like symptoms improve but then return with fever and worse cough. IN ADULTS, EMERGENCY WARNING SIGNS THAT NEED URGENT MEDICAL CARE:  Difficulty breathing or shortness of breath.  Pain or pressure in the chest or abdomen.  Sudden dizziness.  Confusion.  Severe or persistent vomiting.  Bluish color.  You have a oral temperature above 102 F (38.9 C), not controlled by medicine.  Flu-like symptoms improve but return with fever and worse cough. SEEK IMMEDIATE MEDICAL CARE IF:  You or someone you know is experiencing any of the above symptoms. When you arrive at the emergency center, report that you think you have the flu. You may be asked to wear a mask and/or sit in a secluded area to protect others from getting sick. MAKE SURE YOU:   Understand these instructions.  Will watch your condition.  Will get help right away if you are not doing well or get worse. Some of this information courtesy of the CDC.  Document Released: 09/01/2007 Document Revised: 06/07/2011 Document Reviewed: 09/01/2007 [employer] Patient  Information ©2014 [employer]. ° °

## 2013-03-30 NOTE — MAU Note (Signed)
Headache, sore throat, fever (not checked) chills. Non-productive cough.
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
## 2013-03-31 ENCOUNTER — Telehealth (HOSPITAL_COMMUNITY): Payer: Self-pay

## 2013-03-31 LAB — INFLUENZA PANEL BY PCR (TYPE A & B)
H1N1FLUPCR: NOT DETECTED
INFLBPCR: NEGATIVE
Influenza A By PCR: POSITIVE — AB

## 2013-05-25 ENCOUNTER — Encounter (HOSPITAL_COMMUNITY): Payer: Self-pay

## 2013-05-25 ENCOUNTER — Inpatient Hospital Stay (HOSPITAL_COMMUNITY): Payer: 59

## 2013-05-25 ENCOUNTER — Inpatient Hospital Stay (HOSPITAL_COMMUNITY)
Admission: AD | Admit: 2013-05-25 | Discharge: 2013-05-25 | Disposition: A | Discharge status: Home / Self Care | Payer: 59 | Source: Ambulatory Visit | Attending: Obstetrics and Gynecology | Admitting: Obstetrics and Gynecology

## 2013-05-25 DIAGNOSIS — O26619 Liver and biliary tract disorders in pregnancy, unspecified trimester: Secondary | ICD-10-CM

## 2013-05-25 DIAGNOSIS — O219 Vomiting of pregnancy, unspecified: Secondary | ICD-10-CM

## 2013-05-25 DIAGNOSIS — O99891 Other specified diseases and conditions complicating pregnancy: Secondary | ICD-10-CM

## 2013-05-25 DIAGNOSIS — O26613 Liver and biliary tract disorders in pregnancy, third trimester: Secondary | ICD-10-CM

## 2013-05-25 DIAGNOSIS — R12 Heartburn: Secondary | ICD-10-CM | POA: Insufficient documentation

## 2013-05-25 DIAGNOSIS — O24919 Unspecified diabetes mellitus in pregnancy, unspecified trimester: Secondary | ICD-10-CM | POA: Insufficient documentation

## 2013-05-25 DIAGNOSIS — R1011 Right upper quadrant pain: Secondary | ICD-10-CM | POA: Insufficient documentation

## 2013-05-25 DIAGNOSIS — O9989 Other specified diseases and conditions complicating pregnancy, childbirth and the puerperium: Secondary | ICD-10-CM | POA: Insufficient documentation

## 2013-05-25 DIAGNOSIS — O212 Late vomiting of pregnancy: Secondary | ICD-10-CM | POA: Insufficient documentation

## 2013-05-25 DIAGNOSIS — E119 Type 2 diabetes mellitus without complications: Secondary | ICD-10-CM | POA: Insufficient documentation

## 2013-05-25 DIAGNOSIS — K8020 Calculus of gallbladder without cholecystitis without obstruction: Secondary | ICD-10-CM

## 2013-05-25 HISTORY — DX: Anxiety disorder, unspecified: F41.9

## 2013-05-25 HISTORY — DX: Personal history of other diseases of the digestive system: Z87.19

## 2013-05-25 LAB — CBC
HCT: 32.3 % — ABNORMAL LOW (ref 36.0–46.0)
HEMOGLOBIN: 11.4 g/dL — AB (ref 12.0–15.0)
MCH: 28.1 pg (ref 26.0–34.0)
MCHC: 35.3 g/dL (ref 30.0–36.0)
MCV: 79.8 fL (ref 78.0–100.0)
PLATELETS: 280 10*3/uL (ref 150–400)
RBC: 4.05 MIL/uL (ref 3.87–5.11)
RDW: 13.2 % (ref 11.5–15.5)
WBC: 13.8 10*3/uL — AB (ref 4.0–10.5)

## 2013-05-25 LAB — GLUCOSE, CAPILLARY
Glucose-Capillary: 95 mg/dL (ref 70–99)
Glucose-Capillary: 88 mg/dL (ref 70–99)

## 2013-05-25 LAB — COMPREHENSIVE METABOLIC PANEL
ALT: 12 U/L (ref 0–35)
AST: 18 U/L (ref 0–37)
Albumin: 3 g/dL — ABNORMAL LOW (ref 3.5–5.2)
Alkaline Phosphatase: 104 U/L (ref 39–117)
BILIRUBIN TOTAL: 0.4 mg/dL (ref 0.3–1.2)
BUN: 6 mg/dL (ref 6–23)
CALCIUM: 9.5 mg/dL (ref 8.4–10.5)
CHLORIDE: 103 meq/L (ref 96–112)
CO2: 21 meq/L (ref 19–32)
Creatinine, Ser: 0.7 mg/dL (ref 0.50–1.10)
GLUCOSE: 106 mg/dL — AB (ref 70–99)
Potassium: 4.4 mEq/L (ref 3.7–5.3)
Sodium: 139 mEq/L (ref 137–147)
Total Protein: 7.3 g/dL (ref 6.0–8.3)

## 2013-05-25 LAB — AMYLASE: Amylase: 94 U/L (ref 0–105)

## 2013-05-25 LAB — LACTATE DEHYDROGENASE: LDH: 183 U/L (ref 94–250)

## 2013-05-25 LAB — URINALYSIS, ROUTINE W REFLEX MICROSCOPIC
BILIRUBIN URINE: NEGATIVE
Glucose, UA: NEGATIVE mg/dL
Hgb urine dipstick: NEGATIVE
KETONES UR: 15 mg/dL — AB
LEUKOCYTES UA: NEGATIVE
NITRITE: NEGATIVE
Protein, ur: NEGATIVE mg/dL
SPECIFIC GRAVITY, URINE: 1.025 (ref 1.005–1.030)
UROBILINOGEN UA: 0.2 mg/dL (ref 0.0–1.0)
pH: 5.5 (ref 5.0–8.0)

## 2013-05-25 LAB — LIPASE, BLOOD: Lipase: 32 U/L (ref 11–59)

## 2013-05-25 LAB — URIC ACID: Uric Acid, Serum: 4.1 mg/dL (ref 2.4–7.0)

## 2013-05-25 MED ORDER — PROMETHAZINE HCL 25 MG/ML IJ SOLN
25.0000 mg | Freq: Once | INTRAVENOUS | Status: AC
  Administered 2013-05-25: 25 mg via INTRAVENOUS
  Filled 2013-05-25: qty 1

## 2013-05-25 MED ORDER — HYDROMORPHONE HCL PF 1 MG/ML IJ SOLN: 1.0000 mg | Freq: Once | INTRAMUSCULAR | Status: DC

## 2013-05-25 MED ORDER — GI COCKTAIL ~~LOC~~
30.0000 mL | Freq: Once | ORAL | Status: AC
  Administered 2013-05-25: 30 mL via ORAL
  Filled 2013-05-25: qty 30

## 2013-05-25 MED ORDER — FAMOTIDINE IN NACL 20-0.9 MG/50ML-% IV SOLN
20.0000 mg | Freq: Once | INTRAVENOUS | Status: AC
  Administered 2013-05-25: 20 mg via INTRAVENOUS
  Filled 2013-05-25: qty 50

## 2013-05-25 MED ORDER — PROMETHAZINE HCL 25 MG PO TABS: 25.0000 mg | ORAL_TABLET | Freq: Four times a day (QID) | ORAL | Status: DC | PRN

## 2013-05-25 MED ORDER — ONDANSETRON 8 MG/NS 50 ML IVPB
8.0000 mg | Freq: Once | INTRAVENOUS | Status: AC
  Administered 2013-05-25: 8 mg via INTRAVENOUS
  Filled 2013-05-25: qty 8

## 2013-05-25 MED ORDER — OXYCODONE-ACETAMINOPHEN 5-325 MG PO TABS: 2.0000 | ORAL_TABLET | ORAL | Status: DC | PRN

## 2013-05-25 NOTE — Discharge Instructions (Signed)
Cholelithiasis °Cholelithiasis (also called gallstones) is a form of gallbladder disease in which gallstones form in your gallbladder. The gallbladder is an organ that stores bile made in the liver, which helps digest fats. Gallstones begin as small crystals and slowly grow into stones. Gallstone pain occurs when the gallbladder spasms and a gallstone is blocking the duct. Pain can also occur when a stone passes out of the duct.  °RISK FACTORS °· Being female.   °· Having multiple pregnancies. Health care providers sometimes advise removing diseased gallbladders before future pregnancies.   °· Being obese. °· Eating a diet heavy in fried foods and fat.   °· Being older than 60 years and increasing age.   °· Prolonged use of medicines containing female hormones.   °· Having diabetes mellitus.   °· Rapidly losing weight.   °· Having a family history of gallstones (heredity).   °SYMPTOMS °· Nausea.   °· Vomiting. °· Abdominal pain.   °· Yellowing of the skin (jaundice).   °· Sudden pain. It may persist from several minutes to several hours. °· Fever.   °· Tenderness to the touch.  °In some cases, when gallstones do not move into the bile duct, people have no pain or symptoms. These are called "silent" gallstones.  °TREATMENT °Silent gallstones do not need treatment. In severe cases, emergency surgery may be required. Options for treatment include: °· Surgery to remove the gallbladder. This is the most common treatment. °· Medicines. These do not always work and may take 6 12 months or more to work. °· Shock wave treatment (extracorporeal biliary lithotripsy). In this treatment an ultrasound machine sends shock waves to the gallbladder to break gallstones into smaller pieces that can pass into the intestines or be dissolved by medicine. °HOME CARE INSTRUCTIONS  °· Only take over-the-counter or prescription medicines for pain, discomfort, or fever as directed by your health care provider.   °· Follow a low-fat diet until  seen again by your health care provider. Fat causes the gallbladder to contract, which can result in pain.   °· Follow up with your health care provider as directed. Attacks are almost always recurrent and surgery is usually required for permanent treatment.   °SEEK IMMEDIATE MEDICAL CARE IF:  °· Your pain increases and is not controlled by medicines.   °· You have a fever or persistent symptoms for more than 2 3 days.   °· You have a fever and your symptoms suddenly get worse.   °· You have persistent nausea and vomiting.   °MAKE SURE YOU:  °· Understand these instructions. °· Will watch your condition. °· Will get help right away if you are not doing well or get worse. °Document Released: 03/11/2005 Document Revised: 11/15/2012 Document Reviewed: 09/06/2012 °Exit[employer]® Patient Information ©2014 [employer]. ° °

## 2013-05-25 NOTE — MAU Note (Signed)
Pt presents from the office with upper abdominal pain. States she thinks it's her gall bladder. Denies vaginal bleeding or discharge. Reports good fetal movement.
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
## 2013-06-05 ENCOUNTER — Inpatient Hospital Stay (HOSPITAL_COMMUNITY)
Admission: AD | Admit: 2013-06-05 | Discharge: 2013-06-05 | Disposition: A | Discharge status: Home / Self Care | Payer: 59 | Source: Ambulatory Visit | Attending: Obstetrics and Gynecology | Admitting: Obstetrics and Gynecology

## 2013-06-05 ENCOUNTER — Inpatient Hospital Stay (HOSPITAL_COMMUNITY): Payer: 59

## 2013-06-05 ENCOUNTER — Encounter (HOSPITAL_COMMUNITY): Payer: Self-pay | Admitting: *Deleted

## 2013-06-05 DIAGNOSIS — D573 Sickle-cell trait: Secondary | ICD-10-CM | POA: Insufficient documentation

## 2013-06-05 DIAGNOSIS — O99891 Other specified diseases and conditions complicating pregnancy: Secondary | ICD-10-CM | POA: Insufficient documentation

## 2013-06-05 DIAGNOSIS — O9989 Other specified diseases and conditions complicating pregnancy, childbirth and the puerperium: Principal | ICD-10-CM

## 2013-06-05 DIAGNOSIS — O99810 Abnormal glucose complicating pregnancy: Secondary | ICD-10-CM | POA: Insufficient documentation

## 2013-06-05 DIAGNOSIS — O99019 Anemia complicating pregnancy, unspecified trimester: Secondary | ICD-10-CM | POA: Insufficient documentation

## 2013-06-05 DIAGNOSIS — N949 Unspecified condition associated with female genital organs and menstrual cycle: Secondary | ICD-10-CM

## 2013-06-05 LAB — CBC
HCT: 29.6 % — ABNORMAL LOW (ref 36.0–46.0)
HEMOGLOBIN: 10.3 g/dL — AB (ref 12.0–15.0)
MCH: 27.8 pg (ref 26.0–34.0)
MCHC: 34.8 g/dL (ref 30.0–36.0)
MCV: 79.8 fL (ref 78.0–100.0)
Platelets: 248 10*3/uL (ref 150–400)
RBC: 3.71 MIL/uL — AB (ref 3.87–5.11)
RDW: 13.3 % (ref 11.5–15.5)
WBC: 9 10*3/uL (ref 4.0–10.5)

## 2013-06-05 MED ORDER — CYCLOBENZAPRINE HCL 10 MG PO TABS
10.0000 mg | ORAL_TABLET | Freq: Once | ORAL | Status: AC
  Administered 2013-06-05: 10 mg via ORAL
  Filled 2013-06-05: qty 1

## 2013-06-05 MED ORDER — CYCLOBENZAPRINE HCL 10 MG PO TABS: 10.0000 mg | ORAL_TABLET | Freq: Two times a day (BID) | ORAL | Status: DC | PRN

## 2013-06-05 NOTE — MAU Note (Signed)
Pt states she is having round ligament pain that is most intense on the left  Side. Pt states "I am  not able to stand on her left  leg and has not been able to do so since yesterday."
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
## 2013-06-05 NOTE — Progress Notes (Signed)
Pt states pain is a 10 when she attempts to walk. Pt states when resting or sitting pain is a 1

## 2013-06-05 NOTE — Progress Notes (Addendum)
Pt states she is not having pain while she is sitting in the bed.Pt states she only having pain when " i am done walking" Pt states pain is a 10 when she is waking

## 2013-06-05 NOTE — MAU Note (Signed)
Severe pelvic pain since yesterday; states she can't walk. Denies contractions/LOF/vaginal bleeding. Positive fetal movement. Denies complications with pregnancy.

## 2013-06-05 NOTE — Discharge Instructions (Signed)
Abdominal Pain During Pregnancy  Belly (abdominal) pain is common during pregnancy. Most of the time, it is not a serious problem. Other times, it can be a sign that something is wrong with the pregnancy. Always tell your doctor if you have belly pain.  HOME CARE  Monitor your belly pain for any changes. The following actions may help you feel better:  · Do not have sex (intercourse) or put anything in your vagina until you feel better.  · Rest until your pain stops.  · Drink clear fluids if you feel sick to your stomach (nauseous). Do not eat solid food until you feel better.  · Only take medicine as told by your doctor.  · Keep all doctor visits as told.  GET HELP RIGHT AWAY IF:   · You are bleeding, leaking fluid, or pieces of tissue come out of your vagina.  · You have more pain or cramping.  · You keep throwing up (vomiting).  · You have pain when you pee (urinate) or have blood in your pee.  · You have a fever.  · You do not feel your baby moving as much.  · You feel very weak or feel like passing out.  · You have trouble breathing, with or without belly pain.  · You have a very bad headache and belly pain.  · You have fluid leaking from your vagina and belly pain.  · You keep having watery poop (diarrhea).  · Your belly pain does not go away after resting, or the pain gets worse.  MAKE SURE YOU:   · Understand these instructions.  · Will watch your condition.  · Will get help right away if you are not doing well or get worse.  Document Released: 03/03/2009 Document Revised: 11/15/2012 Document Reviewed: 10/12/2012  Exit[employer]® Patient Information ©2014 [employer].

## 2013-06-20 ENCOUNTER — Telehealth (HOSPITAL_COMMUNITY): Payer: Self-pay | Admitting: *Deleted

## 2013-06-20 ENCOUNTER — Encounter (HOSPITAL_COMMUNITY): Payer: Self-pay | Admitting: *Deleted

## 2013-06-20 NOTE — Telephone Encounter (Signed)
Preadmission screen  

## 2013-06-25 ENCOUNTER — Encounter (HOSPITAL_COMMUNITY): Payer: Self-pay

## 2013-06-25 ENCOUNTER — Inpatient Hospital Stay (HOSPITAL_COMMUNITY)
Admission: RE | Admit: 2013-06-25 | Discharge: 2013-06-25 | DRG: 781 | Disposition: A | Discharge status: Home / Self Care | Payer: 59 | Source: Ambulatory Visit | Attending: Obstetrics & Gynecology | Admitting: Obstetrics & Gynecology

## 2013-06-25 DIAGNOSIS — O321XX0 Maternal care for breech presentation, not applicable or unspecified: Principal | ICD-10-CM | POA: Diagnosis present

## 2013-06-25 DIAGNOSIS — O99019 Anemia complicating pregnancy, unspecified trimester: Secondary | ICD-10-CM | POA: Diagnosis present

## 2013-06-25 DIAGNOSIS — O4700 False labor before 37 completed weeks of gestation, unspecified trimester: Secondary | ICD-10-CM | POA: Diagnosis present

## 2013-06-25 DIAGNOSIS — D573 Sickle-cell trait: Secondary | ICD-10-CM | POA: Diagnosis present

## 2013-06-25 DIAGNOSIS — E119 Type 2 diabetes mellitus without complications: Secondary | ICD-10-CM | POA: Diagnosis present

## 2013-06-25 DIAGNOSIS — O09529 Supervision of elderly multigravida, unspecified trimester: Secondary | ICD-10-CM | POA: Diagnosis present

## 2013-06-25 DIAGNOSIS — O24919 Unspecified diabetes mellitus in pregnancy, unspecified trimester: Secondary | ICD-10-CM | POA: Diagnosis present

## 2013-06-25 LAB — GLUCOSE, CAPILLARY: Glucose-Capillary: 139 mg/dL — ABNORMAL HIGH (ref 70–99)

## 2013-06-25 MED ORDER — TERBUTALINE SULFATE 1 MG/ML IJ SOLN
0.2500 mg | Freq: Once | INTRAMUSCULAR | Status: AC
  Administered 2013-06-25: 0.25 mg via SUBCUTANEOUS
  Filled 2013-06-25: qty 1
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
## 2013-06-25 NOTE — Op Note (Signed)
External Cephalic Version  Pt was confirmed breech by ultrasound today.  She desires attempt at ECV.  Risks and benefits have been discussed at length and informed consent was obtained. EFM with reactive tracing and patient was given terbutaline .25mg  SQ.  IV had been placed and Ultrasound at bedside.  Fetal vertex in LUQ was confirmed and fetal buttocks was elevated out of the pelvis.  In a counterclockwise fashion, ECV was attempted but unsuccessful.  Two additional attempts were made unsuccessfully.  Pt and fetus tolerated the procedure well.  EFM was reassuring immediately after the procedure.  Plan to monitor EFM for 1 hour and if remains reassuring will discharge the patient home with routine labor warnings and kickcounts.  Follow up in the office this Thursday.  Will schedule C/S at 39 weeks.

## 2013-07-03 ENCOUNTER — Encounter (HOSPITAL_COMMUNITY): Payer: Self-pay | Admitting: Pharmacist

## 2013-07-04 NOTE — Patient Instructions (Addendum)
   Your procedure is scheduled on:  Tuesday, April 14  [facility] at:  6 AM Pick up the phone at the desk and dial [phone] and inform us of your arrival.  Please call this number if you have any problems the morning of surgery: [phone]  Remember: Do not eat or drink after midnight: Monday Take these medicines the morning of surgery with a SIP OF WATER: Zantac.  Patient instructed to withhold glyburide on Monday night dose and Tuesday day of surgery.  We will check your blood surgar upon arrival.  Do not wear jewelry, make-up, or FINGER nail polish No metal in your hair or on your body. Do not wear lotions, powders, perfumes.  You may wear deodorant.  Do not bring valuables to the hospital. Contacts, dentures or bridgework may not be worn into surgery.  Leave suitcase in the car. After Surgery it may be brought to your room. For patients being admitted to the hospital, checkout time is 11:00am the day of discharge.  Home with husband B.J.  cell [phone]

## 2013-07-05 ENCOUNTER — Encounter (HOSPITAL_COMMUNITY): Payer: Self-pay

## 2013-07-05 ENCOUNTER — Encounter (HOSPITAL_COMMUNITY)
Admission: RE | Admit: 2013-07-05 | Discharge: 2013-07-05 | Disposition: A | Discharge status: Home / Self Care | Payer: 59 | Source: Ambulatory Visit | Attending: Obstetrics & Gynecology | Admitting: Obstetrics & Gynecology

## 2013-07-05 HISTORY — DX: Unspecified osteoarthritis, unspecified site: M19.90

## 2013-07-05 HISTORY — DX: Gastro-esophageal reflux disease without esophagitis: K21.9

## 2013-07-05 HISTORY — DX: Anemia, unspecified: D64.9

## 2013-07-05 LAB — CBC
HCT: 31.1 % — ABNORMAL LOW (ref 36.0–46.0)
Hemoglobin: 10.6 g/dL — ABNORMAL LOW (ref 12.0–15.0)
MCH: 28.3 pg (ref 26.0–34.0)
MCHC: 34.1 g/dL (ref 30.0–36.0)
MCV: 82.9 fL (ref 78.0–100.0)
Platelets: 231 10*3/uL (ref 150–400)
RBC: 3.75 MIL/uL — ABNORMAL LOW (ref 3.87–5.11)
RDW: 14.2 % (ref 11.5–15.5)
WBC: 10 10*3/uL (ref 4.0–10.5)

## 2013-07-05 LAB — BASIC METABOLIC PANEL
BUN: 7 mg/dL (ref 6–23)
CHLORIDE: 106 meq/L (ref 96–112)
CO2: 22 mEq/L (ref 19–32)
Calcium: 9 mg/dL (ref 8.4–10.5)
Creatinine, Ser: 0.87 mg/dL (ref 0.50–1.10)
GFR calc Af Amer: >90 mL/min (ref >90)
GFR, EST NON AFRICAN AMERICAN: 85 mL/min — AB (ref >90)
Glucose, Bld: 74 mg/dL (ref 70–99)
POTASSIUM: 4.9 meq/L (ref 3.7–5.3)
SODIUM: 141 meq/L (ref 137–147)

## 2013-07-05 LAB — TYPE AND SCREEN
ABO/RH(D): O POS
ANTIBODY SCREEN: NEGATIVE

## 2013-07-05 LAB — RPR

## 2013-07-08 ENCOUNTER — Encounter (HOSPITAL_COMMUNITY)
Admission: AD | Disposition: A | Discharge status: Home / Self Care | Payer: Self-pay | Source: Ambulatory Visit | Attending: Obstetrics and Gynecology

## 2013-07-08 ENCOUNTER — Encounter (HOSPITAL_COMMUNITY): Payer: 59 | Admitting: Anesthesiology

## 2013-07-08 ENCOUNTER — Inpatient Hospital Stay (HOSPITAL_COMMUNITY): Payer: 59 | Admitting: Anesthesiology

## 2013-07-08 ENCOUNTER — Encounter (HOSPITAL_COMMUNITY): Payer: Self-pay | Admitting: *Deleted

## 2013-07-08 ENCOUNTER — Inpatient Hospital Stay (HOSPITAL_COMMUNITY)
Admission: AD | Admit: 2013-07-08 | Discharge: 2013-07-11 | DRG: 765 | Disposition: A | Discharge status: Home / Self Care | Payer: 59 | Source: Ambulatory Visit | Attending: Obstetrics and Gynecology | Admitting: Obstetrics and Gynecology

## 2013-07-08 DIAGNOSIS — K219 Gastro-esophageal reflux disease without esophagitis: Secondary | ICD-10-CM | POA: Diagnosis present

## 2013-07-08 DIAGNOSIS — D573 Sickle-cell trait: Secondary | ICD-10-CM | POA: Diagnosis present

## 2013-07-08 DIAGNOSIS — O2432 Unspecified pre-existing diabetes mellitus in childbirth: Secondary | ICD-10-CM | POA: Diagnosis present

## 2013-07-08 DIAGNOSIS — A6000 Herpesviral infection of urogenital system, unspecified: Secondary | ICD-10-CM | POA: Diagnosis present

## 2013-07-08 DIAGNOSIS — O99344 Other mental disorders complicating childbirth: Secondary | ICD-10-CM | POA: Diagnosis present

## 2013-07-08 DIAGNOSIS — O99892 Other specified diseases and conditions complicating childbirth: Secondary | ICD-10-CM | POA: Diagnosis present

## 2013-07-08 DIAGNOSIS — Z98891 History of uterine scar from previous surgery: Secondary | ICD-10-CM

## 2013-07-08 DIAGNOSIS — F411 Generalized anxiety disorder: Secondary | ICD-10-CM | POA: Diagnosis present

## 2013-07-08 DIAGNOSIS — Z22330 Carrier of Group B streptococcus: Secondary | ICD-10-CM

## 2013-07-08 DIAGNOSIS — E669 Obesity, unspecified: Secondary | ICD-10-CM | POA: Diagnosis present

## 2013-07-08 DIAGNOSIS — O99214 Obesity complicating childbirth: Secondary | ICD-10-CM

## 2013-07-08 DIAGNOSIS — O98519 Other viral diseases complicating pregnancy, unspecified trimester: Secondary | ICD-10-CM | POA: Diagnosis present

## 2013-07-08 DIAGNOSIS — O321XX0 Maternal care for breech presentation, not applicable or unspecified: Principal | ICD-10-CM | POA: Diagnosis present

## 2013-07-08 DIAGNOSIS — E119 Type 2 diabetes mellitus without complications: Secondary | ICD-10-CM | POA: Diagnosis present

## 2013-07-08 DIAGNOSIS — O9989 Other specified diseases and conditions complicating pregnancy, childbirth and the puerperium: Secondary | ICD-10-CM

## 2013-07-08 DIAGNOSIS — O9902 Anemia complicating childbirth: Secondary | ICD-10-CM | POA: Diagnosis present

## 2013-07-08 LAB — GLUCOSE, CAPILLARY
Glucose-Capillary: 77 mg/dL (ref 70–99)
Glucose-Capillary: 70 mg/dL (ref 70–99)
Glucose-Capillary: 71 mg/dL (ref 70–99)

## 2013-07-08 LAB — CBC
HEMATOCRIT: 35.2 % — AB (ref 36.0–46.0)
HEMOGLOBIN: 12.1 g/dL (ref 12.0–15.0)
MCH: 28.5 pg (ref 26.0–34.0)
MCHC: 34.4 g/dL (ref 30.0–36.0)
MCV: 82.8 fL (ref 78.0–100.0)
Platelets: 264 10*3/uL (ref 150–400)
RBC: 4.25 MIL/uL (ref 3.87–5.11)
RDW: 14.5 % (ref 11.5–15.5)
WBC: 9.4 10*3/uL (ref 4.0–10.5)

## 2013-07-08 LAB — TYPE AND SCREEN
ABO/RH(D): O POS
Antibody Screen: NEGATIVE

## 2013-07-08 SURGERY — Surgical Case
Anesthesia: Spinal | Site: Abdomen

## 2013-07-08 MED ORDER — NALBUPHINE HCL 10 MG/ML IJ SOLN
5.0000 mg | INTRAMUSCULAR | Status: DC | PRN
  Administered 2013-07-08: 10 mg via INTRAVENOUS
  Administered 2013-07-08 (×2): 5 mg via INTRAVENOUS
  Administered 2013-07-09: 10 mg via INTRAVENOUS
  Filled 2013-07-08: qty 1

## 2013-07-08 MED ORDER — ONDANSETRON HCL 4 MG/2ML IJ SOLN: 4.0000 mg | INTRAMUSCULAR | Status: DC | PRN

## 2013-07-08 MED ORDER — ONDANSETRON HCL 4 MG PO TABS: 4.0000 mg | ORAL_TABLET | ORAL | Status: DC | PRN

## 2013-07-08 MED ORDER — SENNOSIDES-DOCUSATE SODIUM 8.6-50 MG PO TABS
2.0000 | ORAL_TABLET | ORAL | Status: DC
  Administered 2013-07-09 – 2013-07-10 (×3): 2 via ORAL
  Filled 2013-07-08 (×3): qty 2

## 2013-07-08 MED ORDER — DIBUCAINE 1 % RE OINT: 1.0000 "application " | TOPICAL_OINTMENT | RECTAL | Status: DC | PRN

## 2013-07-08 MED ORDER — TETANUS-DIPHTH-ACELL PERTUSSIS 5-2.5-18.5 LF-MCG/0.5 IM SUSP: 0.5000 mL | Freq: Once | INTRAMUSCULAR | Status: DC

## 2013-07-08 MED ORDER — SODIUM CHLORIDE 0.9 % IJ SOLN
3.0000 mL | INTRAMUSCULAR | Status: DC | PRN
  Administered 2013-07-09: 3 mL via INTRAVENOUS

## 2013-07-08 MED ORDER — ZOLPIDEM TARTRATE 5 MG PO TABS: 5.0000 mg | ORAL_TABLET | Freq: Every evening | ORAL | Status: DC | PRN

## 2013-07-08 MED ORDER — DEXTROSE 5 % IV SOLN: 3.0000 g | Freq: Once | INTRAVENOUS | Status: DC

## 2013-07-08 MED ORDER — FAMOTIDINE IN NACL 20-0.9 MG/50ML-% IV SOLN
20.0000 mg | Freq: Once | INTRAVENOUS | Status: AC
  Administered 2013-07-08: 20 mg via INTRAVENOUS
  Filled 2013-07-08: qty 50

## 2013-07-08 MED ORDER — OXYTOCIN 10 UNIT/ML IJ SOLN
INTRAMUSCULAR | Status: AC
  Filled 2013-07-08: qty 4

## 2013-07-08 MED ORDER — LACTATED RINGERS IV SOLN
40.0000 [IU] | INTRAVENOUS | Status: DC | PRN
  Administered 2013-07-08: 40 [IU] via INTRAVENOUS

## 2013-07-08 MED ORDER — GLYBURIDE 2.5 MG PO TABS
2.5000 mg | ORAL_TABLET | Freq: Every day | ORAL | Status: DC
  Filled 2013-07-08 (×3): qty 1

## 2013-07-08 MED ORDER — DEXTROSE IN LACTATED RINGERS 5 % IV SOLN: INTRAVENOUS | Status: DC

## 2013-07-08 MED ORDER — FENTANYL CITRATE 0.05 MG/ML IJ SOLN
INTRAMUSCULAR | Status: DC | PRN
  Administered 2013-07-08: 25 ug via INTRAVENOUS
  Administered 2013-07-08: 25 ug via INTRATHECAL
  Administered 2013-07-08: 50 ug via INTRAVENOUS

## 2013-07-08 MED ORDER — SIMETHICONE 80 MG PO CHEW
80.0000 mg | CHEWABLE_TABLET | Freq: Three times a day (TID) | ORAL | Status: DC
  Administered 2013-07-08 – 2013-07-10 (×5): 80 mg via ORAL
  Filled 2013-07-08 (×7): qty 1

## 2013-07-08 MED ORDER — DIPHENHYDRAMINE HCL 50 MG/ML IJ SOLN: 12.5000 mg | INTRAMUSCULAR | Status: DC | PRN

## 2013-07-08 MED ORDER — OXYCODONE-ACETAMINOPHEN 5-325 MG PO TABS
1.0000 | ORAL_TABLET | ORAL | Status: DC | PRN
  Administered 2013-07-09: 1 via ORAL
  Administered 2013-07-09: 2 via ORAL
  Administered 2013-07-09: 1 via ORAL
  Administered 2013-07-09 – 2013-07-11 (×5): 2 via ORAL
  Filled 2013-07-08 (×3): qty 2
  Filled 2013-07-08: qty 1
  Filled 2013-07-08 (×3): qty 2
  Filled 2013-07-08: qty 1

## 2013-07-08 MED ORDER — HYDROMORPHONE HCL PF 1 MG/ML IJ SOLN
INTRAMUSCULAR | Status: AC
  Administered 2013-07-08: 0.5 mg via INTRAVENOUS
  Filled 2013-07-08: qty 1

## 2013-07-08 MED ORDER — NALBUPHINE HCL 10 MG/ML IJ SOLN
INTRAMUSCULAR | Status: AC
  Administered 2013-07-08: 5 mg via INTRAVENOUS
  Filled 2013-07-08: qty 1

## 2013-07-08 MED ORDER — SIMETHICONE 80 MG PO CHEW
80.0000 mg | CHEWABLE_TABLET | ORAL | Status: DC
  Administered 2013-07-09 – 2013-07-10 (×3): 80 mg via ORAL
  Filled 2013-07-08 (×3): qty 1

## 2013-07-08 MED ORDER — METOCLOPRAMIDE HCL 5 MG/ML IJ SOLN: 10.0000 mg | Freq: Three times a day (TID) | INTRAMUSCULAR | Status: DC | PRN

## 2013-07-08 MED ORDER — ONDANSETRON HCL 4 MG/2ML IJ SOLN
INTRAMUSCULAR | Status: DC | PRN
  Administered 2013-07-08: 4 mg via INTRAVENOUS

## 2013-07-08 MED ORDER — BISACODYL 10 MG RE SUPP
10.0000 mg | Freq: Every day | RECTAL | Status: DC | PRN
  Administered 2013-07-11: 10 mg via RECTAL
  Filled 2013-07-08: qty 1

## 2013-07-08 MED ORDER — KETOROLAC TROMETHAMINE 30 MG/ML IJ SOLN
30.0000 mg | Freq: Four times a day (QID) | INTRAMUSCULAR | Status: AC | PRN
  Administered 2013-07-08: 30 mg via INTRAMUSCULAR

## 2013-07-08 MED ORDER — NALOXONE HCL 0.4 MG/ML IJ SOLN: 0.4000 mg | INTRAMUSCULAR | Status: DC | PRN

## 2013-07-08 MED ORDER — ONDANSETRON HCL 4 MG/2ML IJ SOLN: 4.0000 mg | Freq: Three times a day (TID) | INTRAMUSCULAR | Status: DC | PRN

## 2013-07-08 MED ORDER — LACTATED RINGERS IV SOLN
INTRAVENOUS | Status: DC
  Administered 2013-07-08 – 2013-07-09 (×2): via INTRAVENOUS

## 2013-07-08 MED ORDER — HYDROMORPHONE HCL PF 1 MG/ML IJ SOLN
0.2500 mg | INTRAMUSCULAR | Status: DC | PRN
  Administered 2013-07-08 (×2): 0.5 mg via INTRAVENOUS

## 2013-07-08 MED ORDER — SCOPOLAMINE 1 MG/3DAYS TD PT72
1.0000 | MEDICATED_PATCH | Freq: Once | TRANSDERMAL | Status: AC
  Administered 2013-07-08: 1.5 mg via TRANSDERMAL

## 2013-07-08 MED ORDER — PRENATAL MULTIVITAMIN CH
1.0000 | ORAL_TABLET | Freq: Every day | ORAL | Status: DC
  Administered 2013-07-09 – 2013-07-10 (×2): 1 via ORAL
  Filled 2013-07-08 (×2): qty 1

## 2013-07-08 MED ORDER — ONDANSETRON HCL 4 MG/2ML IJ SOLN
INTRAMUSCULAR | Status: AC
  Filled 2013-07-08: qty 2

## 2013-07-08 MED ORDER — LACTATED RINGERS IV SOLN
INTRAVENOUS | Status: DC | PRN
  Administered 2013-07-08 (×4): via INTRAVENOUS

## 2013-07-08 MED ORDER — MEPERIDINE HCL 25 MG/ML IJ SOLN: 6.2500 mg | INTRAMUSCULAR | Status: DC | PRN

## 2013-07-08 MED ORDER — KETOROLAC TROMETHAMINE 30 MG/ML IJ SOLN
30.0000 mg | Freq: Four times a day (QID) | INTRAMUSCULAR | Status: AC | PRN
  Administered 2013-07-08: 30 mg via INTRAVENOUS
  Filled 2013-07-08: qty 1

## 2013-07-08 MED ORDER — KETOROLAC TROMETHAMINE 30 MG/ML IJ SOLN
INTRAMUSCULAR | Status: AC
  Filled 2013-07-08: qty 1

## 2013-07-08 MED ORDER — SODIUM CHLORIDE 0.9 % IV SOLN: 250.0000 mL | INTRAVENOUS | Status: DC

## 2013-07-08 MED ORDER — SODIUM CHLORIDE 0.9 % IJ SOLN: 3.0000 mL | INTRAMUSCULAR | Status: DC | PRN

## 2013-07-08 MED ORDER — GLYBURIDE 5 MG PO TABS
5.0000 mg | ORAL_TABLET | Freq: Every day | ORAL | Status: DC
  Filled 2013-07-08 (×3): qty 1

## 2013-07-08 MED ORDER — FENTANYL CITRATE 0.05 MG/ML IJ SOLN
INTRAMUSCULAR | Status: AC
  Filled 2013-07-08: qty 2

## 2013-07-08 MED ORDER — PHENYLEPHRINE 8 MG IN D5W 100 ML (0.08MG/ML) PREMIX OPTIME
INJECTION | INTRAVENOUS | Status: DC | PRN
  Administered 2013-07-08: 60 ug/min via INTRAVENOUS

## 2013-07-08 MED ORDER — NALBUPHINE HCL 10 MG/ML IJ SOLN
5.0000 mg | INTRAMUSCULAR | Status: DC | PRN
  Administered 2013-07-09: 10 mg via SUBCUTANEOUS
  Filled 2013-07-08 (×2): qty 1

## 2013-07-08 MED ORDER — NALOXONE HCL 1 MG/ML IJ SOLN
1.0000 ug/kg/h | INTRAVENOUS | Status: DC | PRN
  Filled 2013-07-08: qty 2

## 2013-07-08 MED ORDER — MORPHINE SULFATE 0.5 MG/ML IJ SOLN
INTRAMUSCULAR | Status: AC
  Filled 2013-07-08: qty 10

## 2013-07-08 MED ORDER — MORPHINE SULFATE (PF) 0.5 MG/ML IJ SOLN
INTRAMUSCULAR | Status: DC | PRN
  Administered 2013-07-08: .15 mg via INTRATHECAL

## 2013-07-08 MED ORDER — LACTATED RINGERS IV SOLN
INTRAVENOUS | Status: DC
  Administered 2013-07-08: 09:00:00 via INTRAVENOUS

## 2013-07-08 MED ORDER — OXYTOCIN 40 UNITS IN LACTATED RINGERS INFUSION - SIMPLE MED: 62.5000 mL/h | INTRAVENOUS | Status: AC

## 2013-07-08 MED ORDER — DIPHENHYDRAMINE HCL 25 MG PO CAPS
25.0000 mg | ORAL_CAPSULE | ORAL | Status: DC | PRN
  Administered 2013-07-08: 25 mg via ORAL
  Filled 2013-07-08: qty 1

## 2013-07-08 MED ORDER — DEXTROSE 5 % IV SOLN
3.0000 g | INTRAVENOUS | Status: AC
  Administered 2013-07-08: 3 g via INTRAVENOUS
  Filled 2013-07-08: qty 3000

## 2013-07-08 MED ORDER — LANOLIN HYDROUS EX OINT: 1.0000 "application " | TOPICAL_OINTMENT | CUTANEOUS | Status: DC | PRN

## 2013-07-08 MED ORDER — DIPHENHYDRAMINE HCL 50 MG/ML IJ SOLN: 25.0000 mg | INTRAMUSCULAR | Status: DC | PRN

## 2013-07-08 MED ORDER — SODIUM CHLORIDE 0.9 % IJ SOLN: 3.0000 mL | Freq: Two times a day (BID) | INTRAMUSCULAR | Status: DC

## 2013-07-08 MED ORDER — IBUPROFEN 600 MG PO TABS
600.0000 mg | ORAL_TABLET | Freq: Four times a day (QID) | ORAL | Status: DC
  Administered 2013-07-09 – 2013-07-11 (×10): 600 mg via ORAL
  Filled 2013-07-08 (×10): qty 1

## 2013-07-08 MED ORDER — MENTHOL 3 MG MT LOZG: 1.0000 | LOZENGE | OROMUCOSAL | Status: DC | PRN

## 2013-07-08 MED ORDER — PHENYLEPHRINE 8 MG IN D5W 100 ML (0.08MG/ML) PREMIX OPTIME
INJECTION | INTRAVENOUS | Status: AC
  Filled 2013-07-08: qty 100

## 2013-07-08 MED ORDER — WITCH HAZEL-GLYCERIN EX PADS: 1.0000 "application " | MEDICATED_PAD | CUTANEOUS | Status: DC | PRN

## 2013-07-08 MED ORDER — KETOROLAC TROMETHAMINE 60 MG/2ML IM SOLN
INTRAMUSCULAR | Status: AC
  Filled 2013-07-08: qty 2

## 2013-07-08 MED ORDER — SIMETHICONE 80 MG PO CHEW
80.0000 mg | CHEWABLE_TABLET | ORAL | Status: DC | PRN
  Administered 2013-07-09 (×2): 80 mg via ORAL

## 2013-07-08 MED ORDER — DIPHENHYDRAMINE HCL 25 MG PO CAPS: 25.0000 mg | ORAL_CAPSULE | Freq: Four times a day (QID) | ORAL | Status: DC | PRN

## 2013-07-08 MED ORDER — CITRIC ACID-SODIUM CITRATE 334-500 MG/5ML PO SOLN
30.0000 mL | Freq: Once | ORAL | Status: AC
  Administered 2013-07-08: 30 mL via ORAL
  Filled 2013-07-08: qty 15

## 2013-07-08 MED ORDER — SCOPOLAMINE 1 MG/3DAYS TD PT72
MEDICATED_PATCH | TRANSDERMAL | Status: AC
  Filled 2013-07-08: qty 1

## 2013-07-08 MED ORDER — FLEET ENEMA 7-19 GM/118ML RE ENEM: 1.0000 | ENEMA | Freq: Every day | RECTAL | Status: DC | PRN

## 2013-07-08 SURGICAL SUPPLY — 28 items
CLAMP CORD UMBIL (MISCELLANEOUS) IMPLANT
CLOTH BEACON ORANGE TIMEOUT ST (SAFETY) ×2 IMPLANT
DERMABOND ADHESIVE PROPEN (GAUZE/BANDAGES/DRESSINGS) ×1
DERMABOND ADVANCED .7 DNX6 (GAUZE/BANDAGES/DRESSINGS) ×1 IMPLANT
DRAPE LG THREE QUARTER DISP (DRAPES) IMPLANT
DRSG OPSITE POSTOP 4X10 (GAUZE/BANDAGES/DRESSINGS) ×2 IMPLANT
DURAPREP 26ML APPLICATOR (WOUND CARE) ×2 IMPLANT
ELECT REM PT RETURN 9FT ADLT (ELECTROSURGICAL) ×2
ELECTRODE REM PT RTRN 9FT ADLT (ELECTROSURGICAL) ×1 IMPLANT
EXTRACTOR VACUUM M CUP 4 TUBE (SUCTIONS) IMPLANT
GLOVE BIO SURGEON STRL SZ7 (GLOVE) ×2 IMPLANT
GOWN STRL REUS W/TWL LRG LVL3 (GOWN DISPOSABLE) ×4 IMPLANT
KIT ABG SYR 3ML LUER SLIP (SYRINGE) ×2 IMPLANT
NEEDLE HYPO 25X5/8 SAFETYGLIDE (NEEDLE) ×2 IMPLANT
NS IRRIG 1000ML POUR BTL (IV SOLUTION) ×2 IMPLANT
PACK C SECTION WH (CUSTOM PROCEDURE TRAY) ×2 IMPLANT
PAD OB MATERNITY 4.3X12.25 (PERSONAL CARE ITEMS) ×2 IMPLANT
STAPLER VISISTAT 35W (STAPLE) IMPLANT
SUT CHROMIC 0 CTX 36 (SUTURE) ×4 IMPLANT
SUT MON AB 4-0 PS1 27 (SUTURE) ×2 IMPLANT
SUT PDS AB 0 CT 36 (SUTURE) ×2 IMPLANT
SUT PLAIN 0 NONE (SUTURE) IMPLANT
SUT PLAIN 2 0 XLH (SUTURE) ×2 IMPLANT
SUT VIC AB 3-0 CT1 27 (SUTURE) ×1
SUT VIC AB 3-0 CT1 TAPERPNT 27 (SUTURE) ×1 IMPLANT
TOWEL OR 17X24 6PK STRL BLUE (TOWEL DISPOSABLE) ×2 IMPLANT
TRAY FOLEY CATH 14FR (SET/KITS/TRAYS/PACK) ×2 IMPLANT
WATER STERILE IRR 1000ML POUR (IV SOLUTION) ×2 IMPLANT

## 2013-07-08 NOTE — Lactation Note (Signed)
This note was copied from the chart of Kimberly White Subramaniam. Lactation Consultation Note  Patient Name: Kimberly White [name]'UToday's Date: 07/08/2013 Reason for consult: Initial assessment;Difficult latch;Other (Comment) (low blood sugars; sleepy baby reluctant to open mouth) RN, Tresa EndoKelly reports that baby has low blood sugar (39) and is still refusing to latch to breast, so is to be fed formula (by bottle).  [facility], mom is massaging her breasts and attempting to express some colostrum but is unable and is very sleepy at this time.  RN reports that baby is not sucking vigorously on bottle either but during visit, he is able to finally take 12 ml's and mom will rest, and attempt to latch to breast in 2-3 hours.  LC demonstrated and assisted mom with hand expression, encouraged STS and watching for feeding cues.  Mom attempted to breastfeed her first child, now 5013 months old but states baby never latched.  Mom pumped for 4 months until return to work and still has double personal pump which she used w/first.  LC informed mom that DEBP can be provided later this evening if baby refusing to latch. LC encouraged review of Baby and Me pp 9, 14 and 20-25 for STS and BF information. LC provided [facility] Resource brochure and reviewed [facility] services and list of community and web site resources.     Maternal Data Formula Feeding for Exclusion: No Infant to breast within first hour of birth: No Breastfeeding delayed due to:: Other (comment) (baby offered breast at slightly over an hour of age; sleepy) Has patient been taught Hand Expression?: Yes (LC reviewed and demonstrated but no colostrum expressible) Does the patient have breastfeeding experience prior to this delivery?: Yes  Feeding Feeding Type: Breast Fed  LATCH Score/Interventions Latch: Too sleepy or reluctant, no latch achieved, no sucking elicited. Intervention(s): Skin to skin  Audible Swallowing: None  Type of Nipple: Everted at rest and after  stimulation  Comfort (Breast/Nipple): Soft / non-tender     Hold (Positioning): Full assist, staff holds infant at breast  LATCH Score: 4  Lactation Tools Discussed/Used   STS, hand expression and breast massage, cue feedings Possible need for DEBP if feeding difficulty persists  Consult Status Consult Status: Follow-up Date: 07/09/13 Follow-up type: In-patient    Zara ChessJoanne P Kaytlan Behrman 07/08/2013, 5:55 PM

## 2013-07-08 NOTE — MAU Note (Signed)
Contractions every 2 minutes for the last hour. Denies vaginal bleeding and leaking of fluid. Reports good fetal movement. Scheduled C-Section 4/14 for breech presentation.

## 2013-07-08 NOTE — Anesthesia Procedure Notes (Signed)
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
## 2013-07-08 NOTE — Anesthesia Preprocedure Evaluation (Addendum)
Anesthesia Evaluation  Patient identified by MRN, date of birth, ID band Patient awake    Reviewed: Allergy & Precautions, H&P , Patient's Chart, lab work & pertinent test results  Airway Mallampati: II TM Distance: >3 FB Neck ROM: full    Dental no notable dental hx.    Pulmonary  breath sounds clear to auscultation  Pulmonary exam normal       Cardiovascular Exercise Tolerance: Good Rhythm:regular Rate:Normal     Neuro/Psych    GI/Hepatic GERD-  ,  Endo/Other  diabetes, Type obesity  Renal/GU      Musculoskeletal   Abdominal   Peds  Hematology   Anesthesia Other Findings   Reproductive/Obstetrics                         Anesthesia Physical Anesthesia Plan  ASA: III  Anesthesia Plan: Spinal   Post-op Pain Management:    Induction:   Airway Management Planned:   Additional Equipment:   Intra-op Plan:   Post-operative Plan:   Informed Consent: I have reviewed the patients History and Physical, chart, labs and discussed the procedure including the risks, benefits and alternatives for the proposed anesthesia with the patient or authorized representative who has indicated his/her understanding and acceptance.   Dental Advisory Given  Plan Discussed with: CRNA  Anesthesia Plan Comments: (Lab work confirmed with CRNA in room. Platelets okay. Discussed spinal anesthetic, and patient consents to the procedure:  included risk of possible headache,backache, failed block, allergic reaction, and nerve injury. This patient was asked if she had any questions or concerns before the procedure started. )        Anesthesia Quick Evaluation
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
## 2013-07-08 NOTE — Op Note (Signed)
NAME:  Regino SchultzeJACKSON, Kimberly             ACCOUNT NO.:  [id number]  MEDICAL RECORD NO.:  [id number]  LOCATION:  9126                          [facility]ILITY:  [facility]  PHYSICIAN:  Juluis MireJohn S. Kynslei Art, M.D.   DATE OF BIRTH:  12-19-1980  DATE OF PROCEDURE: DATE OF DISCHARGE:                              OPERATIVE REPORT   PREOPERATIVE DIAGNOSES:  Intrauterine pregnancy at 9838.5 with spontaneous rupture of membranes in breech presentation.  POSTOPERATIVE DIAGNOSES:  Intrauterine pregnancy at 38.5 with spontaneous rupture of membranes in breech presentation.  OPERATIVE PROCEDURE:  Low transverse cesarean section.  SURGEON:  Juluis MireJohn S. Darragh Nay, M.D.  ANESTHESIA:  Spinal.  ESTIMATED BLOOD LOSS:  400 mL.  PACKS:  None.  DRAINS:  Included urethral Foley.  INTRAOPERATIVE BLOOD PLACED:  None.  COMPLICATIONS:  None.  INDICATION:  Dictated in history and physical.  DESCRIPTION OF PROCEDURE:  The patient was taken to the OR and placed in supine position with left lateral tilt.  After satisfactory level of spinal anesthesia obtained, the abdomen was prepped out with DuraPrep. After drying, was draped in sterile field.  A low-transverse skin incision was made with knife, carried through subcutaneous tissue. Anterior rectus fascia was entered sharply, incision was fashioned and extended laterally.  Fascia taken off the muscle superiorly and inferiorly.  Rectus muscles were separated in the midline.  Anterior perineum was entered sharply.  Incision of perineum extended both superiorly and inferiorly.  A low-transverse bladder flap was developed. A low transverse uterine incision begun with a knife and extended laterally using manual traction.  The infant was presented in a frank breech presentation, delivered in usual manner.  Infant was a viable female.  Apgars were 9/9.  Placenta was delivered manually.  Uterus was exteriorized for closure.  Uterus was closed with running locked suture of 0 chromic using a  2-layer closure technique.  Areas of bleeding were brought under control with figure-of-eights of 0 chromic.  Uterus returned to the abdominal cavity.  Tubes and ovaries were unremarkable. We had clear urine output.  Bladder was well below the uterine incision. At this point in time, we irrigated the pelvis, had good hemostasis. Muscles and peritoneum were closed in a running suture of 3-0 Vicryl. Fascia was closed with running suture of 0 PDS.  Skin was closed with staples.  Sponge, instrument, needle count reported as correct by circulating nurse x3.  Foley catheter remained clear at the time of closure.  The patient tolerated the procedure well and returned to recovery room in good condition.     Juluis MireJohn S. Elvina Bosch, M.D.     JSM/MEDQ  D:  07/08/2013  T:  07/08/2013  Job:  [id number]

## 2013-07-08 NOTE — Addendum Note (Signed)
Addendum created 07/08/13 1504 by Renford DillsJanet L Halle Davlin, CRNA   Modules edited: Notes Section   Notes Section:  File: [id number]

## 2013-07-08 NOTE — Transfer of Care (Signed)
Immediate Anesthesia Transfer of Care Note  Patient: Kimberly White  Procedure(s) Performed: Procedure(s): CESAREAN SECTION (N/A)  Patient Location: PACU  Anesthesia Type:Spinal  Level of Consciousness: awake and alert   Airway & Oxygen Therapy: Patient Spontanous Breathing  Post-op Assessment: Report given to PACU RN and Post -op Vital signs reviewed and stable  Post vital signs: Reviewed and stable  Complications: No apparent anesthesia complications

## 2013-07-08 NOTE — Op Note (Signed)
Patient name  Kimberly ChenJackson, Zonya [id number]#[id number] CSN# [id number]  Juluis MireJohn S Blair Mesina, MD 07/08/2013 9:29 AM

## 2013-07-08 NOTE — Brief Op Note (Signed)
07/08/2013  9:28 AM  PATIENT:  Kimberly White  33 y.o. female  PRE-OPERATIVE DIAGNOSIS:  ROM, Breech Presentation  POST-OPERATIVE DIAGNOSIS:  ROM, Breech Presentation  PROCEDURE:  Procedure(s): CESAREAN SECTION (N/A)  SURGEON:  Surgeon(s) and Role:    * Juluis MireJohn S Raydel Hosick, MD - Primary  PHYSICIAN ASSISTANT:   ASSISTANTS: none   ANESTHESIA:   spinal  EBL:  Total I/O In: 1000 [I.V.:1000] Out: 775 [Urine:275; Blood:500]  BLOOD ADMINISTERED:none  DRAINS: Urinary Catheter (Foley)   LOCAL MEDICATIONS USED:  NONE  SPECIMEN:  No Specimen  DISPOSITION OF SPECIMEN:  N/A  COUNTS:  YES  TOURNIQUET:  * No tourniquets in log *  DICTATION: .Other Dictation: Dictation Number [id number]  PLAN OF CARE: Admit to inpatient   PATIENT DISPOSITION:  PACU - hemodynamically stable.   Delay start of Pharmacological VTE agent (>24hrs) due to surgical blood loss or risk of bleeding: no

## 2013-07-08 NOTE — Anesthesia Postprocedure Evaluation (Signed)
  Anesthesia Post-op Note  Patient: Kimberly White  Procedure(s) Performed: Procedure(s): CESAREAN SECTION (N/A) Last Vitals:  Filed Vitals:   07/08/13 1130  BP: 132/82  Pulse: 56  Temp: 36.8 C  Resp: 18     Patient is awake, responsive, moving her legs, and has signs of resolution of her numbness. Pain and nausea are reasonably well controlled. Vital signs are stable and clinically acceptable. Oxygen saturation is clinically acceptable. There are no apparent anesthetic complications at this time. Patient is ready for discharge.

## 2013-07-08 NOTE — Anesthesia Postprocedure Evaluation (Signed)
  Anesthesia Post-op Note  Patient: Kimberly White  Procedure(s) Performed: Procedure(s): CESAREAN SECTION (N/A)  Patient Location: Mother/Baby  Anesthesia Type:Spinal  Level of Consciousness: awake  Airway and Oxygen Therapy: Patient Spontanous Breathing  Post-op Pain: mild  Post-op Assessment: Patient's Cardiovascular Status Stable and Respiratory Function Stable  Post-op Vital Signs: stable  Last Vitals:  Filed Vitals:   07/08/13 1400  BP: 121/81  Pulse: 62  Temp: 36.7 C  Resp: 20    Complications: No apparent anesthesia complications

## 2013-07-09 ENCOUNTER — Encounter (HOSPITAL_COMMUNITY): Payer: Self-pay | Admitting: Obstetrics and Gynecology

## 2013-07-09 LAB — CBC
HEMATOCRIT: 26.4 % — AB (ref 36.0–46.0)
Hemoglobin: 8.9 g/dL — ABNORMAL LOW (ref 12.0–15.0)
MCH: 28 pg (ref 26.0–34.0)
MCHC: 33.7 g/dL (ref 30.0–36.0)
MCV: 83 fL (ref 78.0–100.0)
Platelets: 184 10*3/uL (ref 150–400)
RBC: 3.18 MIL/uL — ABNORMAL LOW (ref 3.87–5.11)
RDW: 14.4 % (ref 11.5–15.5)
WBC: 8.7 10*3/uL (ref 4.0–10.5)

## 2013-07-09 LAB — GLUCOSE, CAPILLARY
Glucose-Capillary: 117 mg/dL — ABNORMAL HIGH (ref 70–99)
Glucose-Capillary: 126 mg/dL — ABNORMAL HIGH (ref 70–99)
Glucose-Capillary: 132 mg/dL — ABNORMAL HIGH (ref 70–99)

## 2013-07-09 NOTE — Progress Notes (Signed)
Patient set up with a DEBP to assist with lactation needs. Set up and initiated.

## 2013-07-09 NOTE — Lactation Note (Signed)
This note was copied from the chart of Kimberly Danae ChenCatrina Fellner. Lactation Consultation Note Follow up consult:  Mother requested help breastfeeding. Reviewed hand expression and mother had glistening of colostrum. Baby had 20 ml formula at 1200 and sleepy. Attempted latching baby in football hold with breast compression.  Baby does not open wide, massaged jaws. Mother has large pendulous breasts, semi flat, compressible. Baby would not latch.  Introduced #20 NS.  Baby would mouth but would not suck.  Prefilled with formula.  Baby only sucked for a few minutes. Baby not cueing.  Encouraged mother to post pump for 15 minutes and call for assistance with next feeding. Patient Name: Kimberly White WUJWJ'XToday's Date: 07/09/2013 Reason for consult: Follow-up assessment   Maternal Data    Feeding Feeding Type: Breast Fed Length of feed: 5 min  LATCH Score/Interventions Latch: Repeated attempts needed to sustain latch, nipple held in mouth throughout feeding, stimulation needed to elicit sucking reflex. (sleepy did not sustain latch) Intervention(s): Skin to skin;Teach feeding cues;Waking techniques Intervention(s): Breast compression;Breast massage;Assist with latch;Adjust position  Audible Swallowing: A few with stimulation Intervention(s): Hand expression  Type of Nipple: Flat Intervention(s): Shells;Hand pump;Double electric pump  Comfort (Breast/Nipple): Soft / non-tender     Hold (Positioning): Assistance needed to correctly position infant at breast and maintain latch.  LATCH Score: 6  Lactation Tools Discussed/Used Tools: Nipple Dorris CarnesShields;Pump Nipple shield size: 20 Breast pump type: Double-Electric Breast Pump   Consult Status Consult Status: Follow-up Date: 07/10/13 Follow-up type: In-patient    Kimberly White 07/09/2013, 2:54 PM

## 2013-07-09 NOTE — Lactation Note (Signed)
This note was copied from the chart of Kimberly Danae ChenCatrina Heinzelman. Lactation Consultation Note Mom using DEBP. Very sleepy. Baby STS. Having low Bld sugar issues. No colostrum noted. Going to demonstrate cleaning pump parts, kept falling a sleep. Mom so sleepy couldn't hold DEBP so RN made hands free mess bra. Encouraged to BF and pump on feeding cues and at least every three hrs. Patient Name: Kimberly White ZOXWR'UToday's Date: 07/09/2013     Maternal Data    Feeding Feeding Type: Bottle Fed - Formula  [facility]/Interventions                      Lactation Tools Discussed/Used     Consult Status      Charyl DancerLaura G Rokia Bosket 07/09/2013, 3:10 AM

## 2013-07-09 NOTE — Progress Notes (Signed)
Subjective: Postpartum Day 1: Cesarean Delivery Patient reports tolerating PO and no problems voiding.    Objective: Vital signs in last 24 hours: Temp:  [97.8 F (36.6 C)-98.4 F (36.9 C)] 97.9 F (36.6 C) (04/13 0823) Pulse Rate:  [51-103] 55 (04/13 0823) Resp:  [18-20] 18 (04/13 0823) BP: (91-137)/(65-100) 108/71 mmHg (04/13 0823) SpO2:  [97 %-100 %] 98 % (04/13 0823)  Physical Exam:  General: alert and cooperative Lochia: appropriate Uterine Fundus: firm Incision: honeycomb dressing CDI DVT Evaluation: No evidence of DVT seen on physical exam. Negative Homan's sign. No cords or calf tenderness. Calf/Ankle edema is present.   Recent Labs  07/08/13 0805 07/09/13 0600  HGB 12.1 8.9*  HCT 35.2* 26.4*    Assessment/Plan: Status post Cesarean section. Doing well postoperatively.  Plans circ in pediatrician's office.  Kimberly White 07/09/2013, 8:30 AM

## 2013-07-10 ENCOUNTER — Inpatient Hospital Stay (HOSPITAL_COMMUNITY): Admission: RE | Admit: 2013-07-10 | Payer: 59 | Source: Ambulatory Visit | Admitting: Obstetrics & Gynecology

## 2013-07-10 SURGERY — Surgical Case
Anesthesia: Regional

## 2013-07-10 NOTE — Progress Notes (Signed)
Subjective: Postpartum Day 2: Cesarean Delivery Patient reports incisional pain, tolerating PO, + flatus and no problems voiding.    Objective: Vital signs in last 24 hours: Temp:  [98.3 F (36.8 C)-98.4 F (36.9 C)] 98.3 F (36.8 C) (04/14 0558) Pulse Rate:  [60-69] 69 (04/14 0558) Resp:  [18-20] 18 (04/14 0558) BP: (114-117)/(76-80) 114/76 mmHg (04/14 0558)  Physical Exam:  General: alert and cooperative Lochia: appropriate Uterine Fundus: firm Incision: healing well, honeycomb dressing CDI DVT Evaluation: No evidence of DVT seen on physical exam. Negative Homan's sign. No cords or calf tenderness. Calf/Ankle edema is present.   Recent Labs  07/08/13 0805 07/09/13 0600  HGB 12.1 8.9*  HCT 35.2* 26.4*    Assessment/Plan: Status post Cesarean section. Doing well postoperatively.  Continue current care.  Kimberly White 07/10/2013, 8:46 AM

## 2013-07-10 NOTE — Progress Notes (Signed)
Patient requesting regular diet ordered instead of carb modified. Patient states that blood sugars have been controlled well without meds. Dr. Vincente PoliGrewal notified and requested regular diet. Dr. Vincente PoliGrewal stated that regular diet was fine. Kimberly White

## 2013-07-11 MED ORDER — OXYCODONE-ACETAMINOPHEN 5-325 MG PO TABS: 1.0000 | ORAL_TABLET | ORAL | Status: DC | PRN

## 2013-07-11 MED ORDER — IBUPROFEN 600 MG PO TABS: 600.0000 mg | ORAL_TABLET | Freq: Four times a day (QID) | ORAL | Status: DC

## 2013-07-11 MED ORDER — HYDROCORTISONE ACE-PRAMOXINE 1-1 % RE FOAM: 1.0000 | Freq: Two times a day (BID) | RECTAL | Status: DC

## 2013-07-11 NOTE — Discharge Summary (Signed)
Obstetric Discharge Summary Reason for Admission: rupture of membranes Prenatal Procedures: ultrasound Intrapartum Procedures: cesarean: low cervical, transverse Postpartum Procedures: none Complications-Operative and Postpartum: none Hemoglobin  Date Value Ref Range Status  07/09/2013 8.9* 12.0 - 15.0 g/dL Final     REPEATED TO VERIFY     DELTA CHECK NOTED     HCT  Date Value Ref Range Status  07/09/2013 26.4* 36.0 - 46.0 % Final    Physical Exam:  General: alert and cooperative Lochia: appropriate Uterine Fundus: firm Incision: honeycomb dressing CDI DVT Evaluation: No evidence of DVT seen on physical exam. Negative Homan's sign. No cords or calf tenderness. Calf/Ankle edema is present.  Discharge Diagnoses: Term Pregnancy-delivered  Discharge Information: Date: 07/11/2013 Activity: pelvic rest Diet: routine Medications: PNV, Ibuprofen, Percocet and proctofoam HC Condition: stable Instructions: refer to practice specific booklet Discharge to: home   Newborn Data: Live born female  Birth Weight: 8 lb 2.5 oz (3700 g) APGAR: 9, 9  Home with mother.  Kimberly BlonderCarol G Tytus White 07/11/2013, 8:33 AM

## 2013-07-11 NOTE — Progress Notes (Signed)
Pt c/o constipation and pain after dulcolax (04:15 today), pt stated she had 2 small BM's and still feels sore in her rectum. 3 Moderate firm hemmorhoids present; blood on gloved finger after suppository administration.

## 2013-08-08 IMAGING — US US OB DETAIL+14 WK
1 series · 12 of 28 positions shown · non-contrast
Comparison: none

[Series 1: us ob detail+14 wk · 0.22mm/px · 96 acquisitions, 12 frames shown]
[im 4/96]
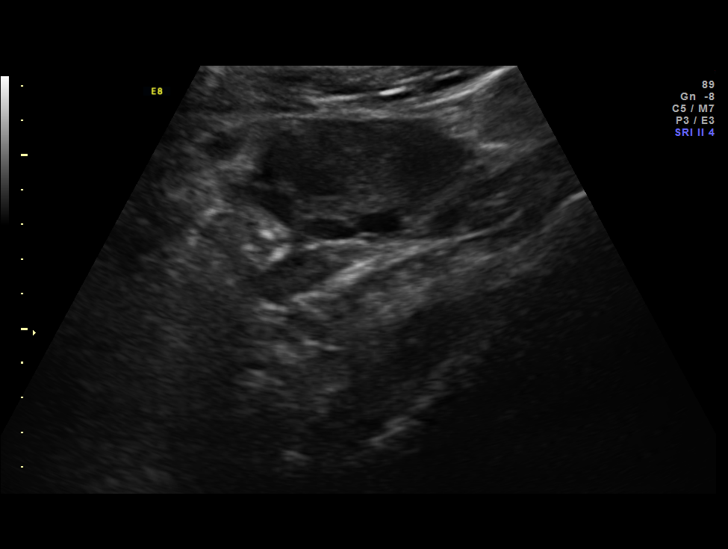
[im 11/96]
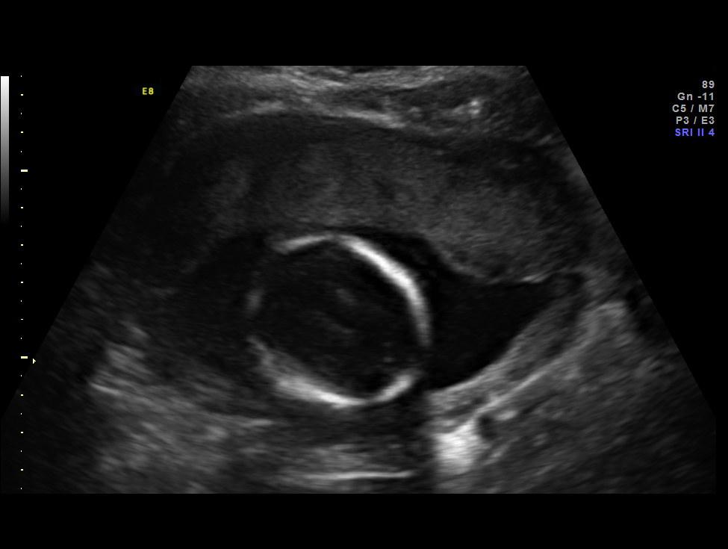
[im 18/96]
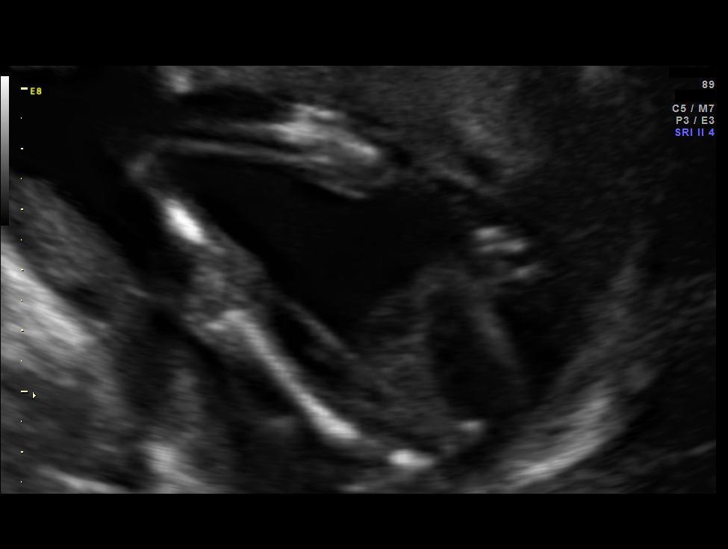
[im 29/96]
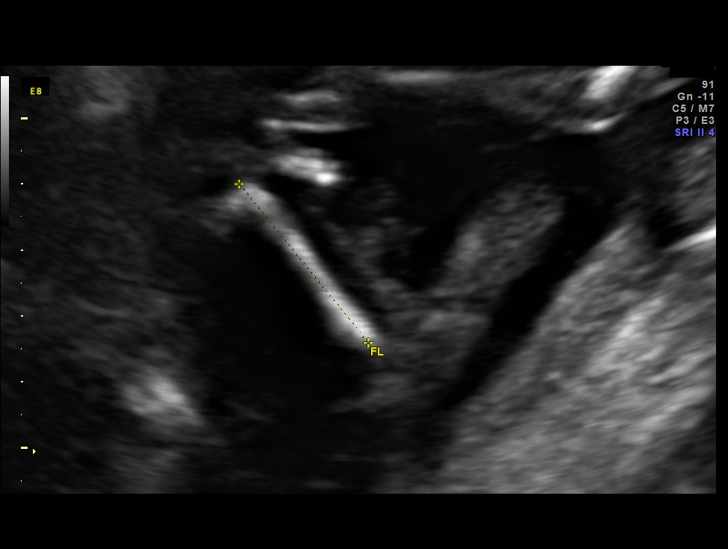
[im 36/96]
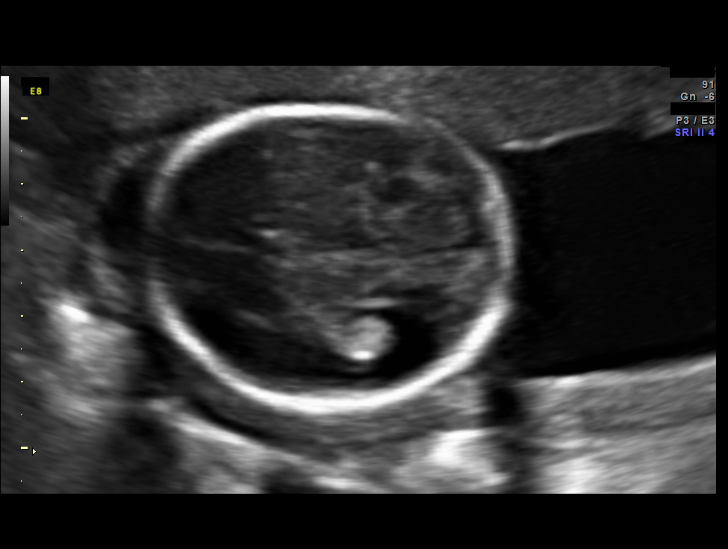
[im 43/96]
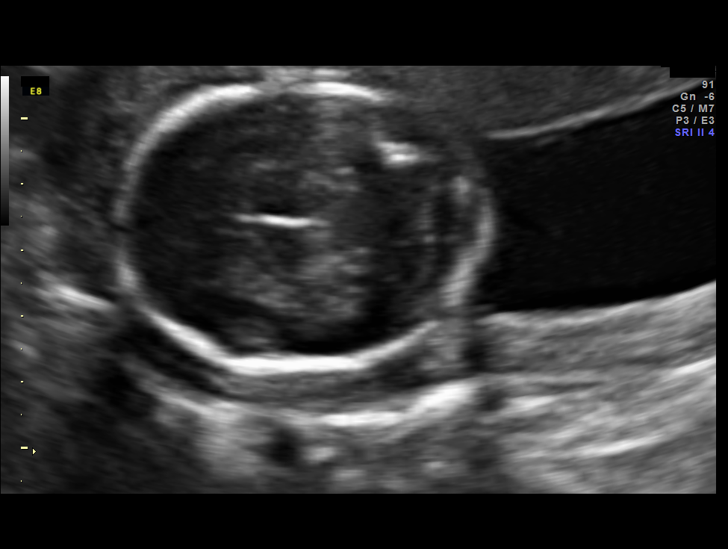
[im 53/96]
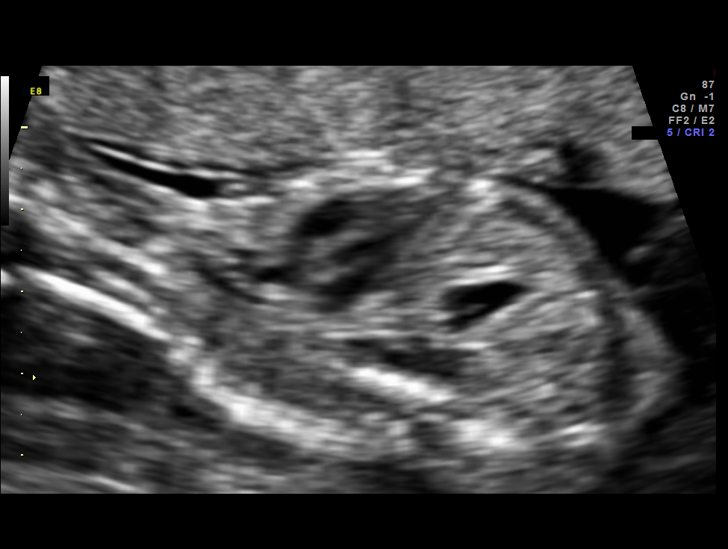
[im 60/96]
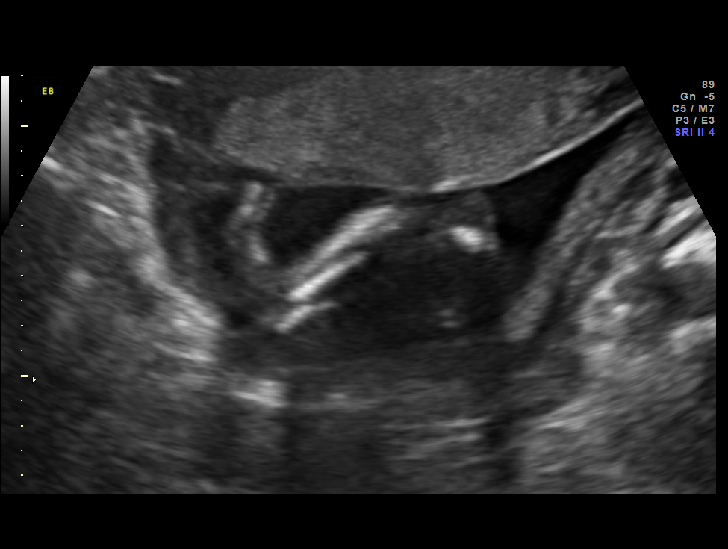
[im 67/96]
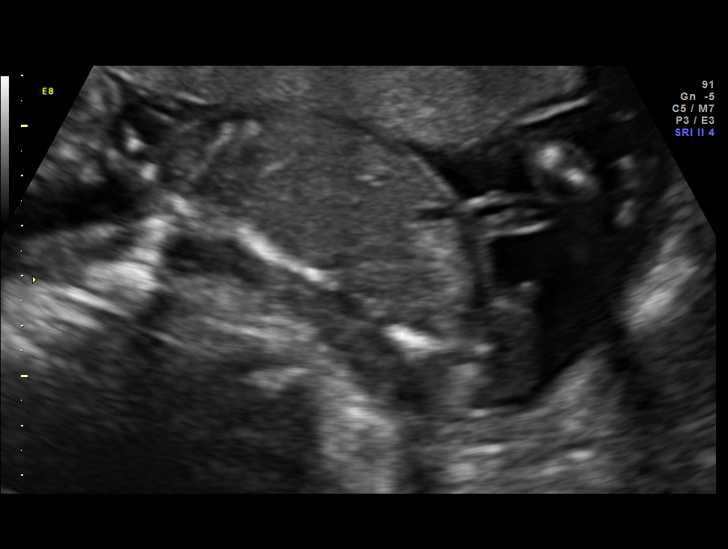
[im 78/96]
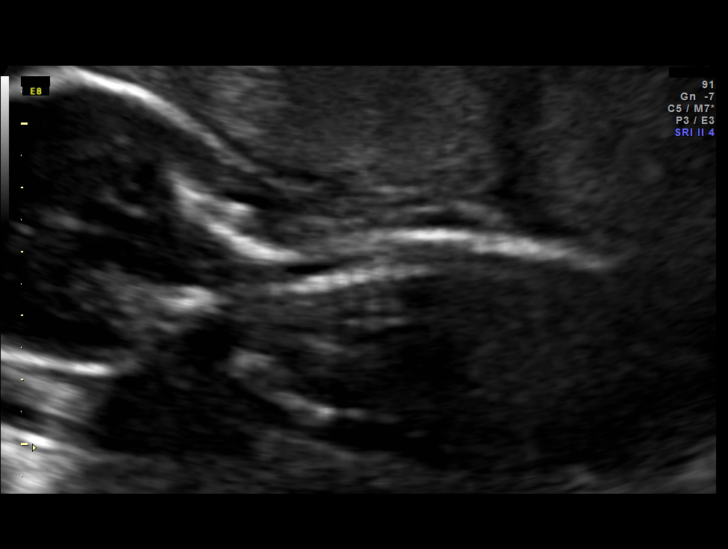
[im 85/96]
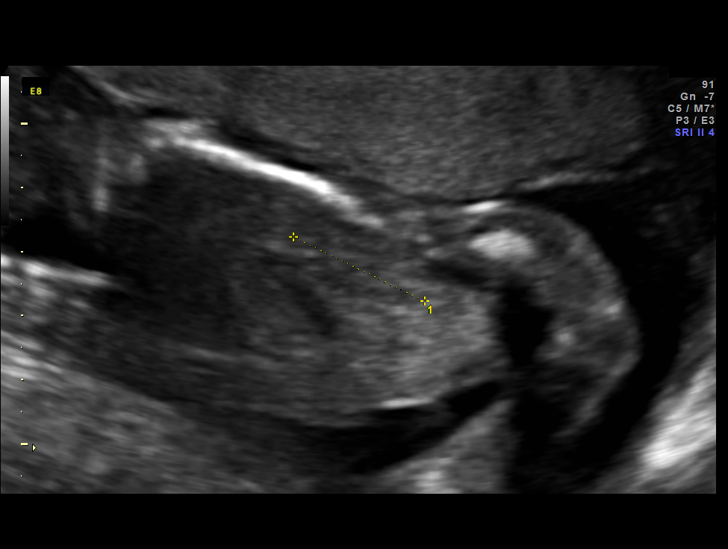
[im 92/96]
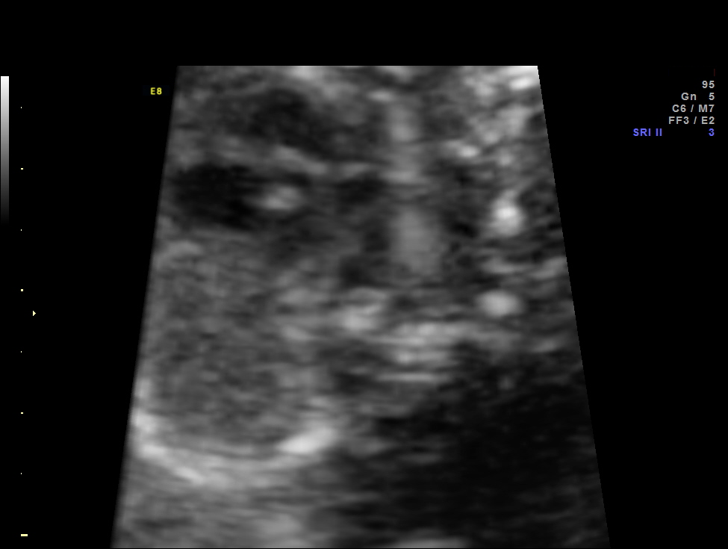

[12 of 28 positions shown; findings below may reference images not displayed]

OBSTETRICS REPORT
                      (Signed Final 01/20/2012 [DATE])

Service(s) Provided

 US OB DETAIL + 14 WK                                  76811.0
Indications

 Diabetes - Pregestational, type 2; no end organ
 disease
 Sickle cell disease trait; FOB is SC trait negative
 Detailed fetal anatomic survey
Fetal Evaluation

 Num Of Fetuses:    1
 Fetal Heart Rate:  150                          bpm
 Cardiac Activity:  Observed
 Presentation:      Breech
 Placenta:          Anterior, above cervical os
 P. Cord            Visualized
 Insertion:

 Amniotic Fluid
 AFI FV:      Subjectively within normal limits
                                             Larg Pckt:     4.1  cm
Biometry

 BPD:     43.4  mm     G. Age:  19w 1d                CI:         72.7   70 - 86
 OFD:     59.7  mm                                    FL/HC:      18.7   16.8 -

 HC:     167.8  mm     G. Age:  19w 3d       37  %    HC/AC:      1.21   1.09 -

 AC:     138.6  mm     G. Age:  19w 2d       36  %    FL/BPD:
 FL:      31.3  mm     G. Age:  19w 5d       48  %    FL/AC:      22.6   20 - 24
 HUM:     29.1  mm     G. Age:  19w 3d       50  %
 CER:     19.7  mm     G. Age:  18w 6d       34  %
 NFT:      4.2  mm

 Est. FW:     294  gm    0 lb 10 oz      45  %
Gestational Age

 LMP:           20w 4d        Date:  08/29/11                 EDD:   06/04/12
 U/S Today:     19w 3d                                        EDD:   06/12/12
 Best:          19w 4d     Det. By:  Early Ultrasound         EDD:   06/11/12
Anatomy

 Cranium:          Appears normal         Aortic Arch:      Appears normal
 Fetal Cavum:      Appears normal         Ductal Arch:      Appears normal
 Ventricles:       Appears normal         Diaphragm:        Appears normal
 Choroid Plexus:   Appears normal         Stomach:          Appears normal, left
                                                            sided
 Cerebellum:       Appears normal         Abdomen:          Appears normal
 Posterior Fossa:  Appears normal         Abdominal Wall:   Appears nml (cord
                                                            insert, abd wall)
 Nuchal Fold:      Appears normal         Cord Vessels:     Appears normal (3
                                                            vessel cord)
 Face:             Appears normal         Kidneys:          Appear normal
                   (orbits and profile)
 Lips:             Appears normal         Bladder:          Appears normal
 Heart:            Appears normal         Spine:            Appears normal
                   (4CH, axis, and
                   situs)
 RVOT:             Appears normal         Lower             Appears normal
                                          Extremities:
 LVOT:             Appears normal         Upper             Appears normal
                                          Extremities:

 Other:  Fetus appears to be a male. Heels and 5th digit appear normal.
Targeted Anatomy

 Fetal Central Nervous System
 Cisterna Magna:
Cervix Uterus Adnexa

 Cervical Length:    3.3      cm
 Left Ovary:    Not visualized. No adnexal mass visualized.
 Right Ovary:   Within normal limits.
Impression

 IUP at 19+3 weeks
 Normal detailed fetal anatomy
 Markers of aneuploidy: none
 Normal amniotic fluid volume
 Measurements consistent with prior US
Recommendations

 Follow-up as clinically indicated

 questions or concerns.

## 2013-11-25 ENCOUNTER — Encounter (HOSPITAL_COMMUNITY): Payer: Self-pay | Admitting: Emergency Medicine

## 2013-11-25 ENCOUNTER — Emergency Department (HOSPITAL_COMMUNITY)
Admission: EM | Admit: 2013-11-25 | Discharge: 2013-11-25 | Disposition: A | Discharge status: Home / Self Care | Payer: 59 | Attending: Emergency Medicine | Admitting: Emergency Medicine

## 2013-11-25 DIAGNOSIS — Z3202 Encounter for pregnancy test, result negative: Secondary | ICD-10-CM | POA: Insufficient documentation

## 2013-11-25 DIAGNOSIS — Z79899 Other long term (current) drug therapy: Secondary | ICD-10-CM | POA: Diagnosis not present

## 2013-11-25 DIAGNOSIS — Z791 Long term (current) use of non-steroidal anti-inflammatories (NSAID): Secondary | ICD-10-CM | POA: Diagnosis not present

## 2013-11-25 DIAGNOSIS — E119 Type 2 diabetes mellitus without complications: Secondary | ICD-10-CM | POA: Diagnosis not present

## 2013-11-25 DIAGNOSIS — X503XXA Overexertion from repetitive movements, initial encounter: Secondary | ICD-10-CM

## 2013-11-25 DIAGNOSIS — Z8659 Personal history of other mental and behavioral disorders: Secondary | ICD-10-CM | POA: Diagnosis not present

## 2013-11-25 DIAGNOSIS — E669 Obesity, unspecified: Secondary | ICD-10-CM | POA: Insufficient documentation

## 2013-11-25 DIAGNOSIS — Z8619 Personal history of other infectious and parasitic diseases: Secondary | ICD-10-CM | POA: Diagnosis not present

## 2013-11-25 DIAGNOSIS — Z8719 Personal history of other diseases of the digestive system: Secondary | ICD-10-CM | POA: Insufficient documentation

## 2013-11-25 DIAGNOSIS — X500XXA Overexertion from strenuous movement or load, initial encounter: Secondary | ICD-10-CM

## 2013-11-25 DIAGNOSIS — D649 Anemia, unspecified: Secondary | ICD-10-CM | POA: Insufficient documentation

## 2013-11-25 DIAGNOSIS — M129 Arthropathy, unspecified: Secondary | ICD-10-CM | POA: Diagnosis not present

## 2013-11-25 DIAGNOSIS — R42 Dizziness and giddiness: Secondary | ICD-10-CM | POA: Insufficient documentation

## 2013-11-25 LAB — I-STAT CHEM 8, ED
BUN: 11 mg/dL (ref 6–23)
CHLORIDE: 105 meq/L (ref 96–112)
Calcium, Ion: 1.24 mmol/L — ABNORMAL HIGH (ref 1.12–1.23)
Creatinine, Ser: 0.8 mg/dL (ref 0.50–1.10)
Glucose, Bld: 201 mg/dL — ABNORMAL HIGH (ref 70–99)
HCT: 37 % (ref 36.0–46.0)
Hemoglobin: 12.6 g/dL (ref 12.0–15.0)
Potassium: 4 mEq/L (ref 3.7–5.3)
Sodium: 140 mEq/L (ref 137–147)
TCO2: 24 mmol/L (ref 0–100)

## 2013-11-25 LAB — CBC
HEMATOCRIT: 33.9 % — AB (ref 36.0–46.0)
Hemoglobin: 11.6 g/dL — ABNORMAL LOW (ref 12.0–15.0)
MCH: 27.9 pg (ref 26.0–34.0)
MCHC: 34.2 g/dL (ref 30.0–36.0)
MCV: 81.5 fL (ref 78.0–100.0)
Platelets: 259 10*3/uL (ref 150–400)
RBC: 4.16 MIL/uL (ref 3.87–5.11)
RDW: 12.4 % (ref 11.5–15.5)
WBC: 13 10*3/uL — AB (ref 4.0–10.5)

## 2013-11-25 LAB — POC URINE PREG, ED: Preg Test, Ur: NEGATIVE

## 2013-11-25 NOTE — ED Notes (Signed)
Po challenge complete pt tolerated  well she also ambulated independently

## 2013-11-25 NOTE — ED Notes (Signed)
Per EMS pt was at church when had witnessed syncopal episode. 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  Pt hasnt checked her blood sugar since she was in her second trimester of her pregnancy and baby is now 58 months old.   Pt's CBG on scene was 234, pt stated that her sugar has never been that high before. Pt is also very stressed and worked the past 20+consecutive days.  Pt's Bp was 92/54 lying and 96/64 upon standing.  Pt stated that her cycles are irregular and never gave straight answer to if she could possibly be pregnant or not.

## 2013-11-25 NOTE — Discharge Instructions (Signed)

## 2013-11-25 NOTE — ED Provider Notes (Signed)
Medical screening examination/treatment/procedure(s) were performed by non-physician practitioner and as supervising physician I was immediately available for consultation/collaboration.   EKG Interpretation   Date/Time:  Sunday November 25 2013 13:51:58 EDT Ventricular Rate:  81 PR Interval:  155 QRS Duration: 85 QT Interval:  381 QTC Calculation: 442 R Axis:   54 Text Interpretation:  Sinus rhythm No previous tracing Confirmed by KNAPP   MD-J, JON ([id number]) on 11/25/2013 2:15:01 PM        Benny Lennert, MD 11/25/13 [phone]
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
## 2013-11-25 NOTE — ED Notes (Signed)
Bed: WA10 Expected date:  Expected time:  Means of arrival:  Comments: EMS 

## 2013-11-26 LAB — CBG MONITORING, ED: Glucose-Capillary: 205 mg/dL — ABNORMAL HIGH (ref 70–99)

## 2014-01-28 ENCOUNTER — Encounter (HOSPITAL_COMMUNITY): Payer: Self-pay | Admitting: Emergency Medicine

## 2014-12-12 IMAGING — US US ABDOMEN LIMITED
1 series · 14 of 25 positions shown · non-contrast
Comparison: None.

CLINICAL DATA: Right upper quadrant abdominal pain. Elevated white
blood cell count.

EXAM:
US ABDOMEN LIMITED - RIGHT UPPER QUADRANT

[Series 1: us abdomen limited ruq/ascites · 41 acquisitions, 14 frames shown]
[im 1/41]
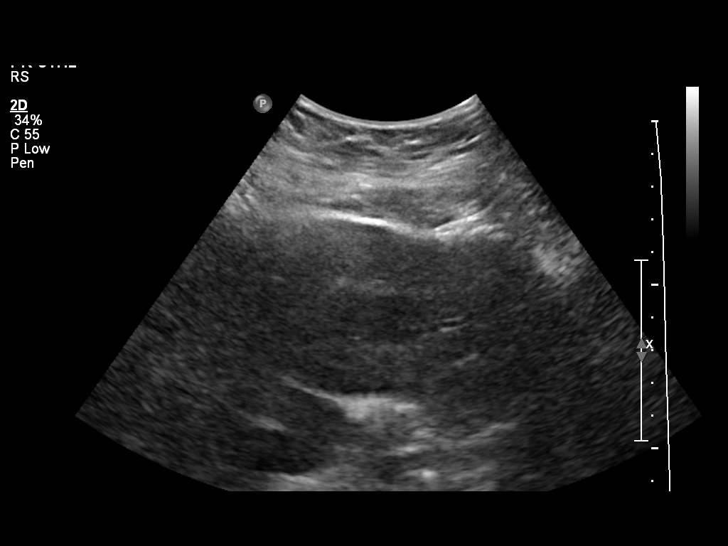
[im 4/41]
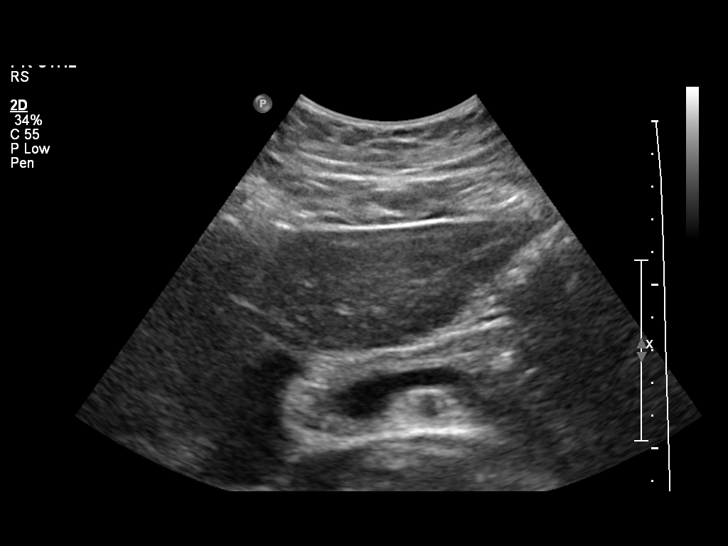
[im 7/41]
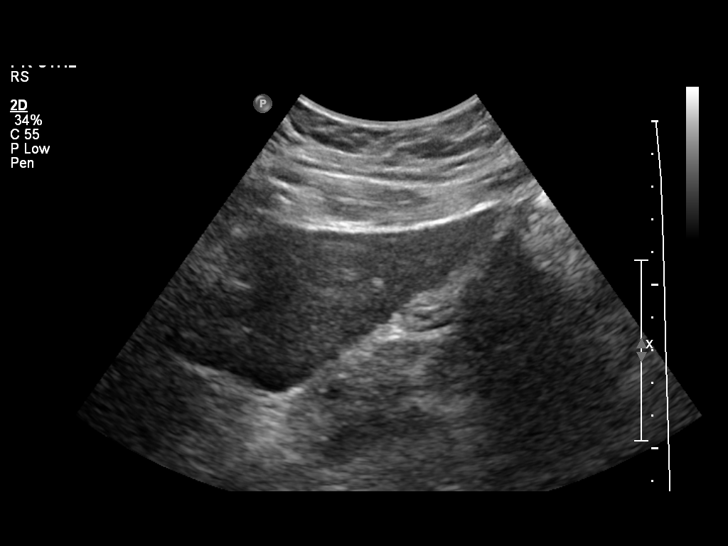
[im 11/41]
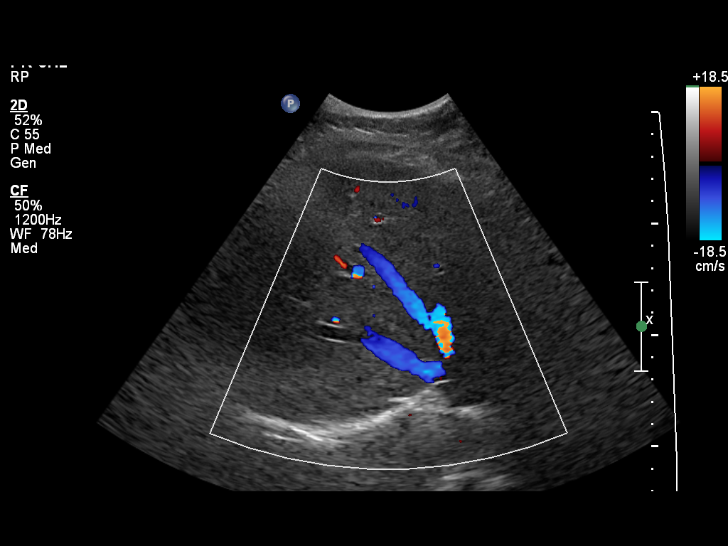
[im 14/41]
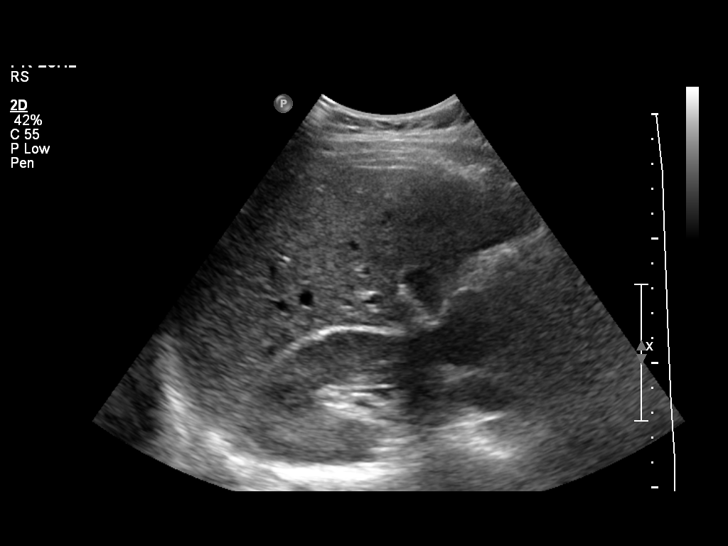
[im 16/41]
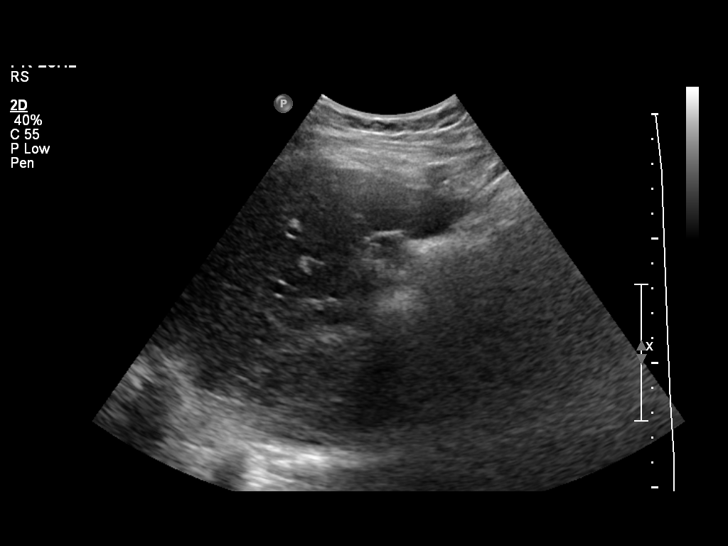
[im 19/41]
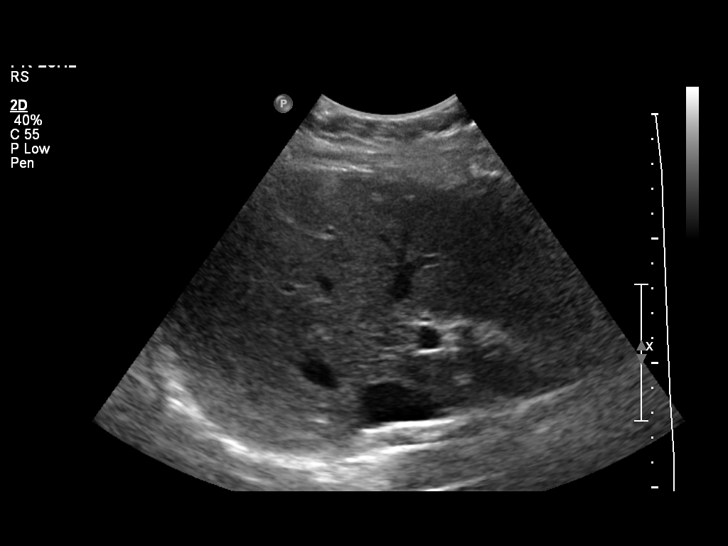
[im 22/41]
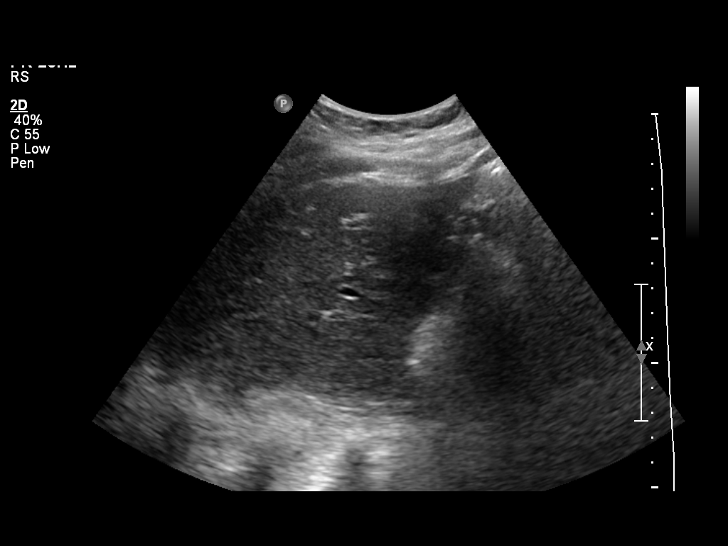
[im 26/41]
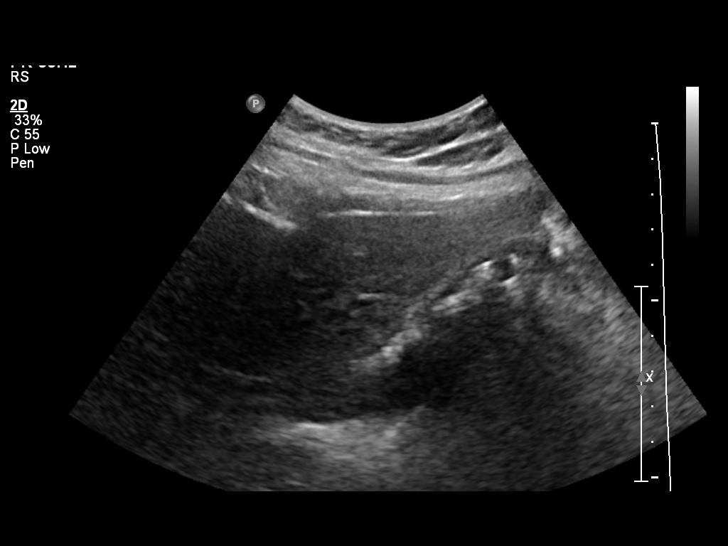
[im 27/41]
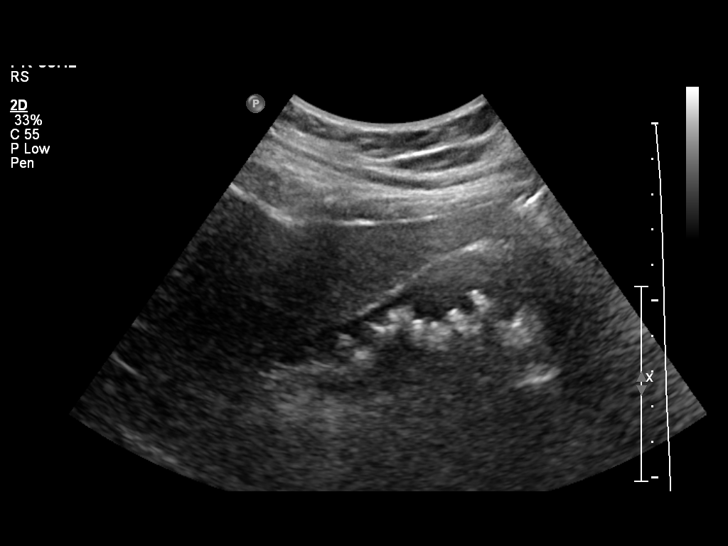
[im 31/41]
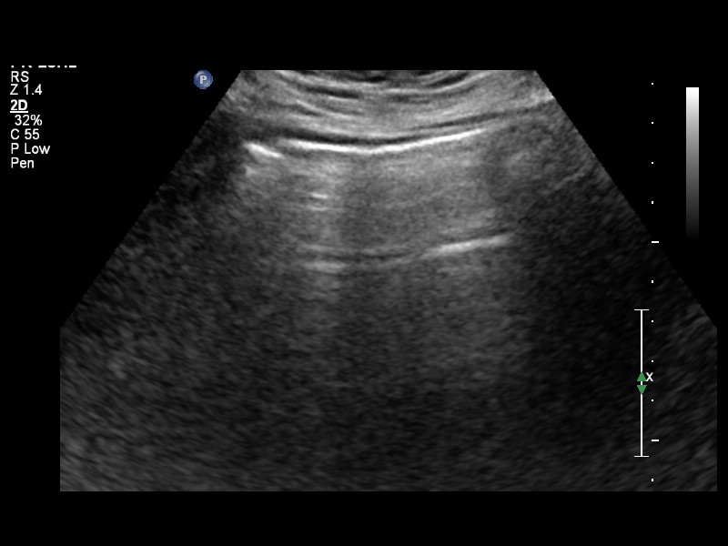
[im 34/41]
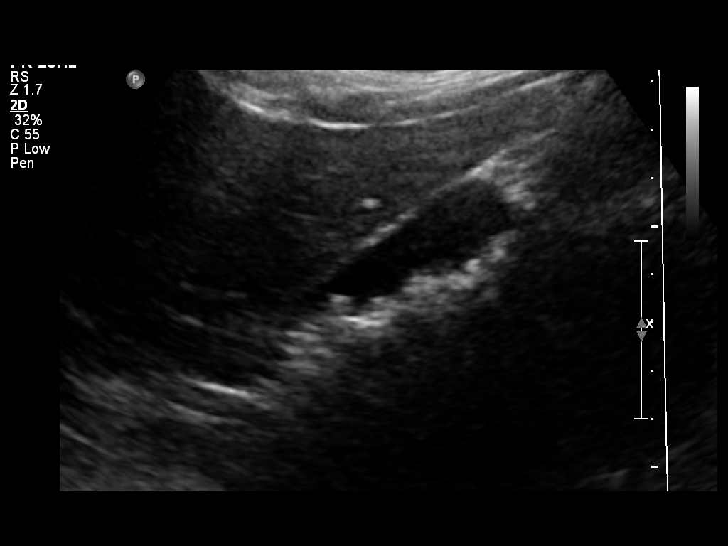
[im 37/41]
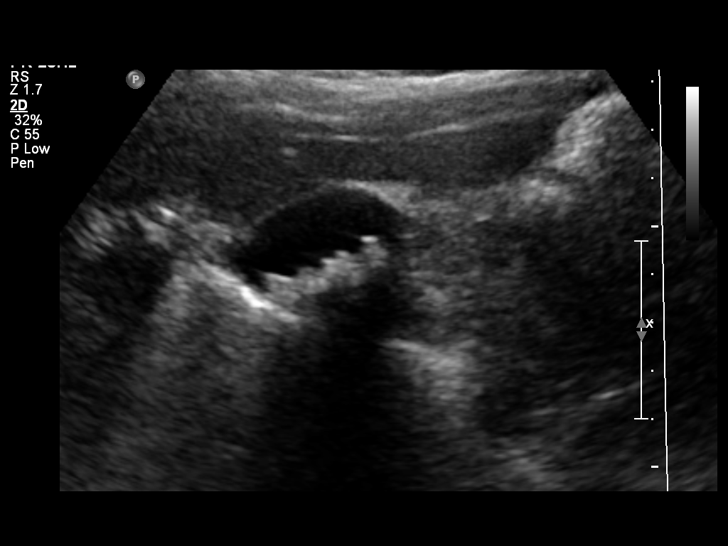
[im 41/41]
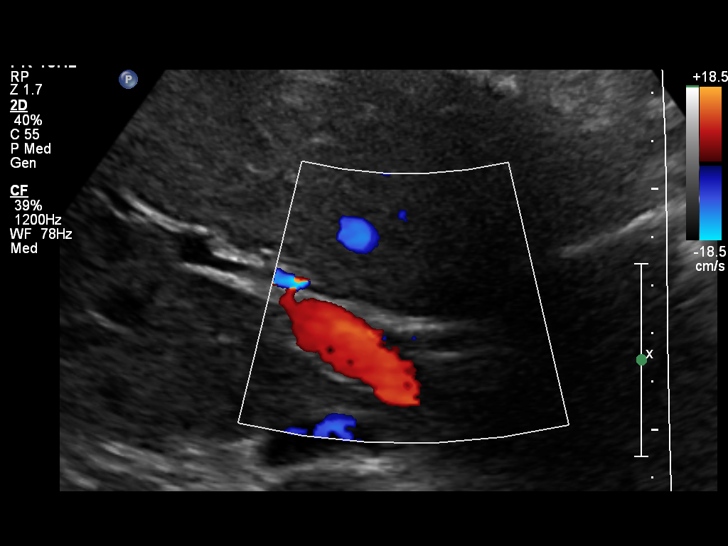

[14 of 25 positions shown; findings below may reference images not displayed]

FINDINGS: Gallbladder:

There are numerous shadowing gallstones. The gallbladder is
moderately distended. There is no wall thickening. There is no
tenderness to transducer pressure over the gallbladder. No
pericholecystic fluid.

Common bile duct:

Diameter: 5.4 mm.  No evidence of a duct stone.

Liver:

Mildly enlarged. Otherwise unremarkable. No mass or focal lesion.
Normal hepatopetal flow in the portal vein.
IMPRESSION: Cholelithiasis with no evidence of acute cholecystitis.

Mild hepatomegaly.

## 2014-12-23 IMAGING — US US OB LIMITED
1 series · 13 of 16 positions shown · non-contrast
Comparison: none

[Series 1: us fetal bpp w/o nonstress · non-contrast · 16 acquisitions, 13 frames shown]
[im 1/16]
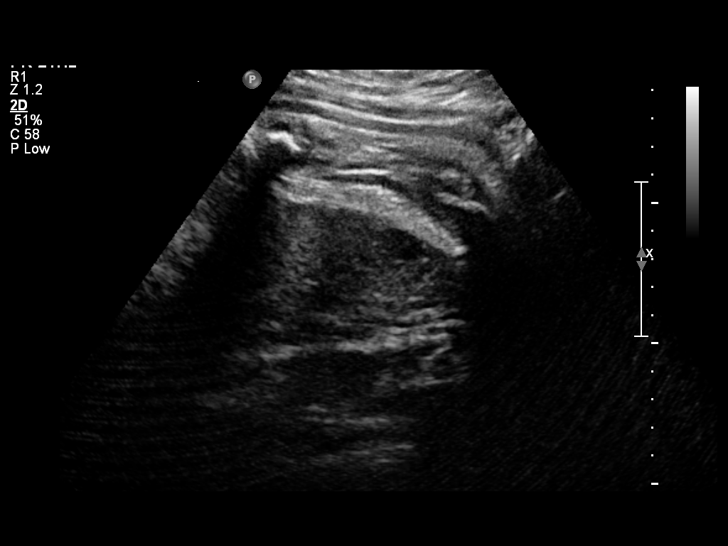
[im 2/16]
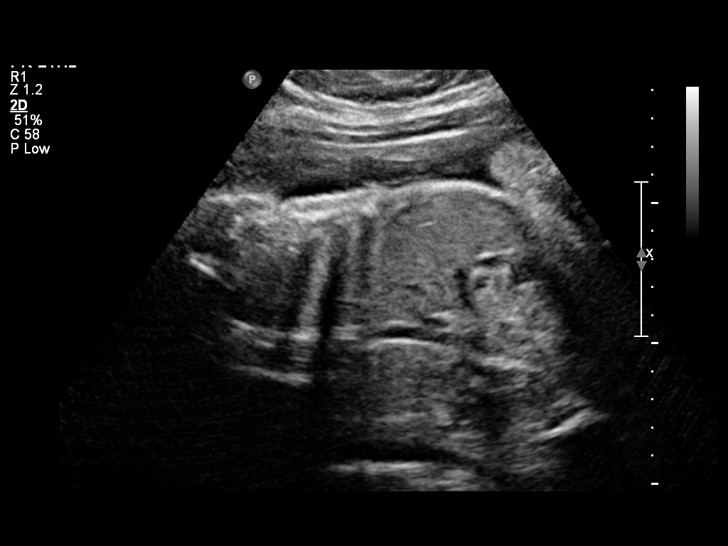
[im 4/16]
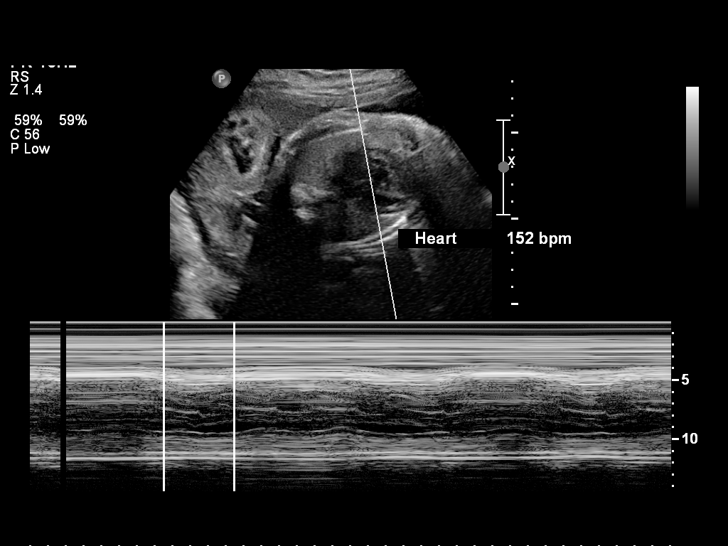
[im 5/16]
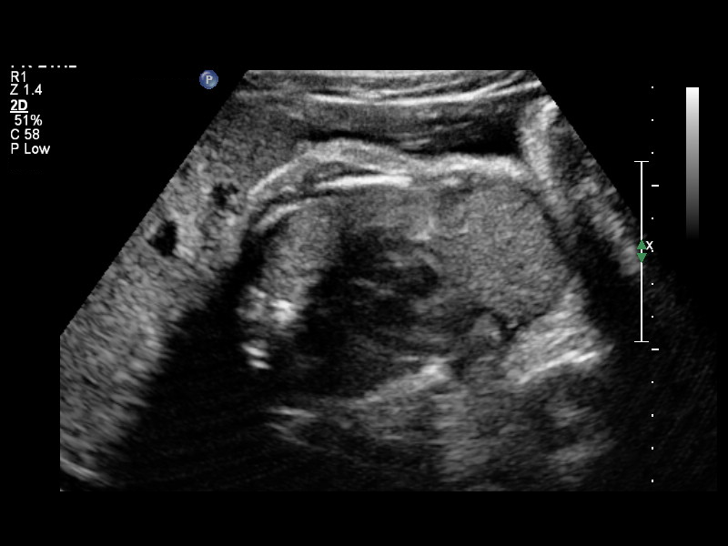
[im 6/16]
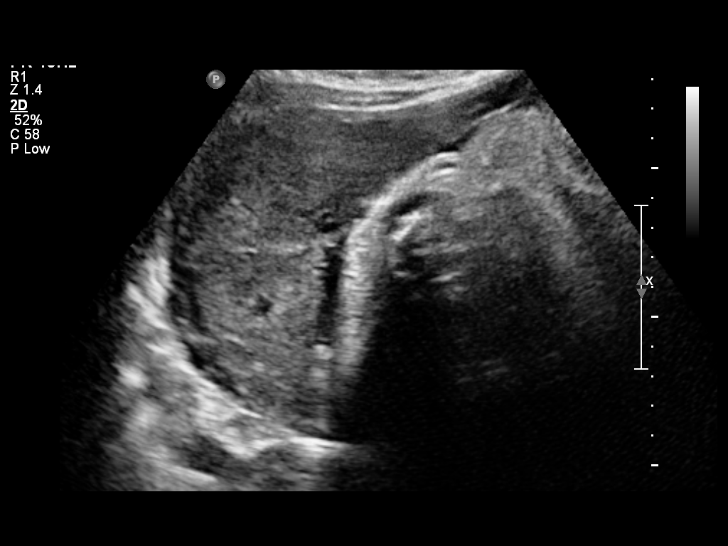
[im 7/16]
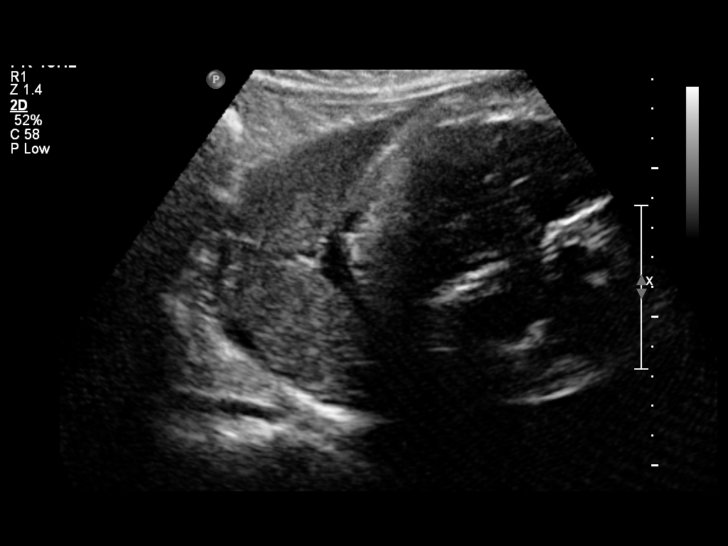
[im 9/16]
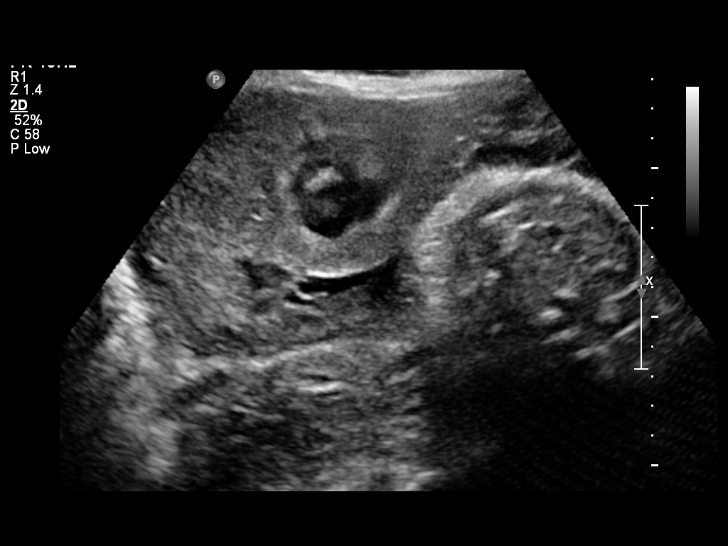
[im 10/16]
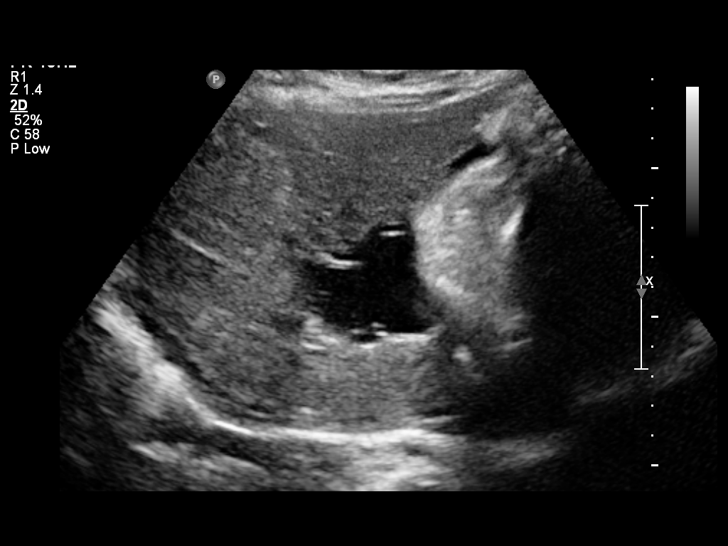
[im 11/16]
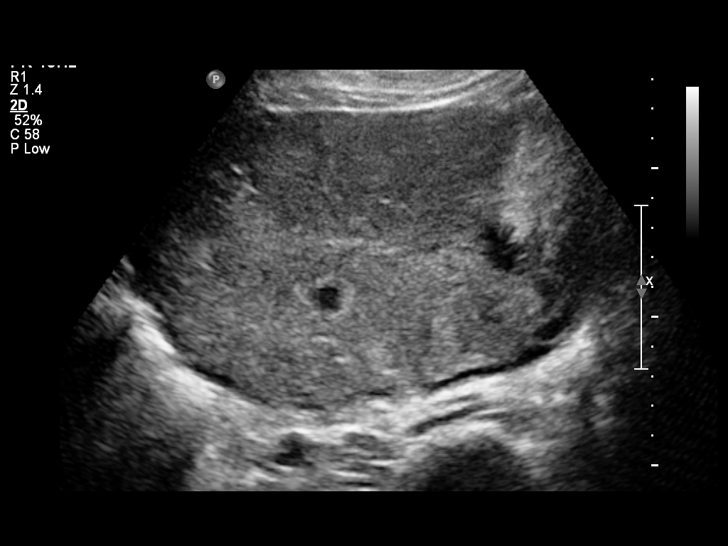
[im 12/16]
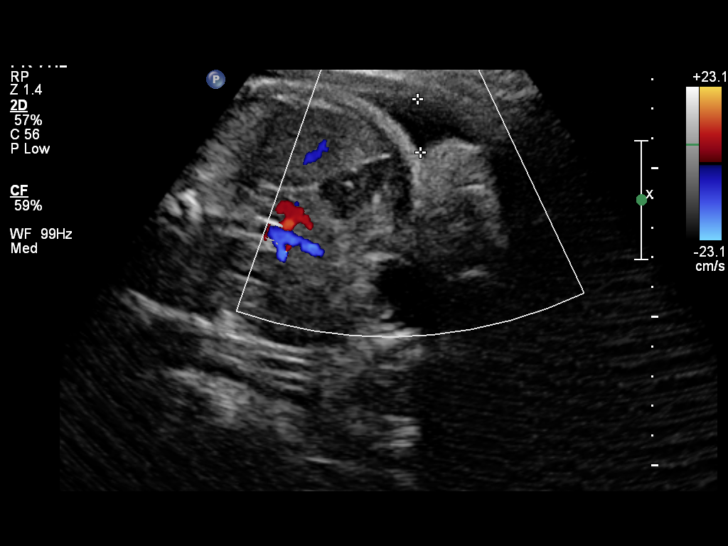
[im 13/16]
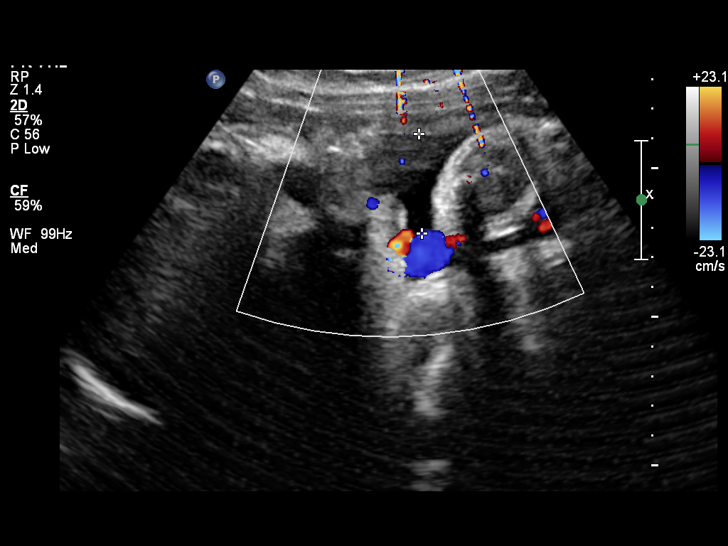
[im 15/16]
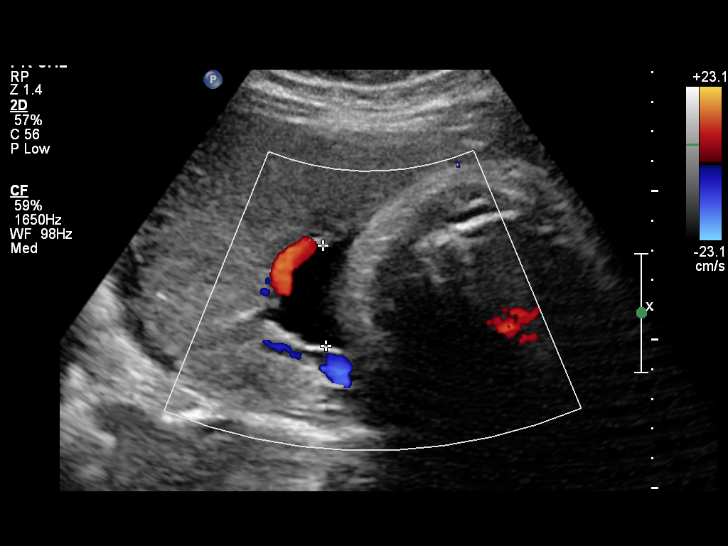
[im 16/16]
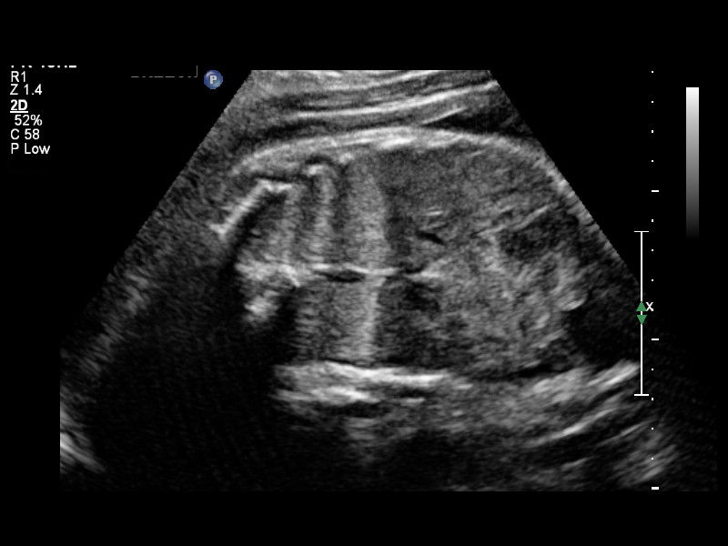

[13 of 16 positions shown; findings below may reference images not displayed]

OBSTETRICS REPORT
                      (Signed Final 06/06/2013 [DATE])

Service(s) Provided

 [HOSPITAL]                                         76815.0
Indications

 Pelvic / Abdominal pain
 Diabetes - Pregestational on glyburide
 Advanced maternal age (AMA), Multigravida - low
 risk NIPS
 Sickle cell trait
 Herpes simplex virus (QUIRIJN)
 Previous cervical surgery (colposcopy)
 Marginal vs. Velamentous placental cord insertion
 Short interval between pregnancies
 Obesity complicating pregnancy                        649.13,
Fetal Evaluation

 Num Of Fetuses:    1
 Fetal Heart Rate:  152                          bpm
 Cardiac Activity:  Observed
 Presentation:      Breech
 Placenta:          Fundal RT, above cervical
                    os

 Amniotic Fluid
 AFI FV:      Subjectively low-normal
 AFI Sum:     11.24   cm       27  %Tile     Larg Pckt:    3.36  cm
 RUQ:   3.36    cm   RLQ:    1.79   cm    LUQ:   2.76    cm   LLQ:    3.33   cm
Biophysical Evaluation

 Amniotic F.V:   Within normal limits       F. Tone:        Observed
 F. Movement:    Observed                   Score:          [DATE]
 F. Breathing:   Observed
Gestational Age

 LMP:           33w 1d        Date:  10/16/12                 EDD:   07/23/13
 Best:          33w 1d     Det. By:  LMP  (10/16/12)          EDD:   07/23/13
Impression

 Single IUP at 33 [DATE] weeks
 Active fetus with BPP of [DATE]
 Normal amniotic fluid volume
Recommendations

 Recommend continued antepartum fetal testing as scheduled
 Serial growth scans every 3-4 weeks due to pregestational
 diabetes
